# Patient Record
Sex: Female | Born: 1990 | Race: White | Hispanic: No | Marital: Single | Smoking: Former smoker
Health system: Southern US, Community
[De-identification: ages and names within clinical notes are randomized; demographics above are authoritative.]

## PROBLEM LIST (undated history)

## (undated) DIAGNOSIS — J45909 Unspecified asthma, uncomplicated: Secondary | ICD-10-CM

## (undated) DIAGNOSIS — I10 Essential (primary) hypertension: Secondary | ICD-10-CM

## (undated) HISTORY — DX: Unspecified asthma, uncomplicated: J45.909

---
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
## 2010-07-05 ENCOUNTER — Emergency Department: Payer: Self-pay | Admitting: Emergency Medicine

## 2010-11-24 ENCOUNTER — Encounter: Payer: Self-pay | Admitting: Anesthesiology

## 2010-12-03 ENCOUNTER — Encounter: Payer: Self-pay | Admitting: Anesthesiology

## 2011-05-13 ENCOUNTER — Encounter: Payer: Self-pay | Admitting: Anesthesiology

## 2011-06-05 ENCOUNTER — Encounter: Payer: Self-pay | Admitting: Anesthesiology

## 2011-07-13 ENCOUNTER — Emergency Department: Payer: Self-pay | Admitting: Emergency Medicine

## 2012-07-14 ENCOUNTER — Emergency Department: Payer: Self-pay | Admitting: Emergency Medicine

## 2012-07-14 LAB — HCG, QUANTITATIVE, PREGNANCY: Beta Hcg, Quant.: <1 m[IU]/mL — ABNORMAL LOW

## 2013-01-14 ENCOUNTER — Emergency Department: Payer: Self-pay | Admitting: Emergency Medicine

## 2013-01-14 LAB — CBC
HCT: 39.3 % (ref 35.0–47.0)
HGB: 13.3 g/dL (ref 12.0–16.0)
MCH: 30.7 pg (ref 26.0–34.0)
MCHC: 33.9 g/dL (ref 32.0–36.0)
MCV: 91 fL (ref 80–100)
RBC: 4.34 10*6/uL (ref 3.80–5.20)
RDW: 13 % (ref 11.5–14.5)

## 2013-01-14 LAB — COMPREHENSIVE METABOLIC PANEL
Albumin: 3.8 g/dL (ref 3.4–5.0)
Alkaline Phosphatase: 69 U/L (ref 50–136)
BUN: 11 mg/dL (ref 7–18)
Calcium, Total: 9 mg/dL (ref 8.5–10.1)
Co2: 22 mmol/L (ref 21–32)
EGFR (African American): >60
EGFR (Non-African Amer.): >60
Osmolality: 274 (ref 275–301)
SGOT(AST): 19 U/L (ref 15–37)
Sodium: 137 mmol/L (ref 136–145)
Total Protein: 7.2 g/dL (ref 6.4–8.2)

## 2013-01-14 LAB — TROPONIN I: Troponin-I: <0.02 ng/mL

## 2013-06-16 ENCOUNTER — Emergency Department: Payer: Self-pay | Admitting: Emergency Medicine

## 2013-07-23 ENCOUNTER — Emergency Department: Payer: Self-pay | Admitting: Emergency Medicine

## 2013-07-23 LAB — URINALYSIS, COMPLETE
Glucose,UR: NEGATIVE mg/dL (ref 0–75)
Ketone: NEGATIVE
Protein: NEGATIVE
Specific Gravity: 1.011 (ref 1.003–1.030)
Squamous Epithelial: 3
WBC UR: 1 /HPF (ref 0–5)

## 2014-03-08 ENCOUNTER — Emergency Department: Payer: Self-pay | Admitting: Emergency Medicine

## 2014-03-09 LAB — HCG, QUANTITATIVE, PREGNANCY: Beta Hcg, Quant.: 65960 m[IU]/mL — ABNORMAL HIGH

## 2014-03-09 LAB — CBC
HCT: 40.2 % (ref 35.0–47.0)
HGB: 13.5 g/dL (ref 12.0–16.0)
MCH: 30.2 pg (ref 26.0–34.0)
MCHC: 33.6 g/dL (ref 32.0–36.0)
MCV: 90 fL (ref 80–100)
PLATELETS: 269 10*3/uL (ref 150–440)
RBC: 4.47 10*6/uL (ref 3.80–5.20)
RDW: 12.7 % (ref 11.5–14.5)
WBC: 11.5 10*3/uL — ABNORMAL HIGH (ref 3.6–11.0)

## 2014-03-09 LAB — URINALYSIS, COMPLETE
BLOOD: NEGATIVE
Bacteria: NONE SEEN
Bilirubin,UR: NEGATIVE
GLUCOSE, UR: NEGATIVE mg/dL (ref 0–75)
Ketone: NEGATIVE
LEUKOCYTE ESTERASE: NEGATIVE
Nitrite: NEGATIVE
PROTEIN: NEGATIVE
Ph: 5 (ref 4.5–8.0)
RBC,UR: 1 /HPF (ref 0–5)
SPECIFIC GRAVITY: 1.023 (ref 1.003–1.030)
Squamous Epithelial: <1
WBC UR: <1 /HPF (ref 0–5)

## 2014-03-09 LAB — GC/CHLAMYDIA PROBE AMP

## 2014-03-09 LAB — WET PREP, GENITAL

## 2014-09-29 ENCOUNTER — Inpatient Hospital Stay: Payer: Self-pay

## 2014-09-29 LAB — CBC WITH DIFFERENTIAL/PLATELET
Basophil #: 0.1 10*3/uL (ref 0.0–0.1)
Basophil %: 0.7 %
EOS ABS: 0.3 10*3/uL (ref 0.0–0.7)
Eosinophil %: 2.5 %
HCT: 39 % (ref 35.0–47.0)
HGB: 12.9 g/dL (ref 12.0–16.0)
Lymphocyte #: 1.8 10*3/uL (ref 1.0–3.6)
Lymphocyte %: 17.1 %
MCH: 30.4 pg (ref 26.0–34.0)
MCHC: 33.1 g/dL (ref 32.0–36.0)
MCV: 92 fL (ref 80–100)
Monocyte #: 0.9 x10 3/mm (ref 0.2–0.9)
Monocyte %: 8.5 %
NEUTROS ABS: 7.5 10*3/uL — AB (ref 1.4–6.5)
Neutrophil %: 71.2 %
Platelet: 206 10*3/uL (ref 150–440)
RBC: 4.25 10*6/uL (ref 3.80–5.20)
RDW: 14.7 % — ABNORMAL HIGH (ref 11.5–14.5)
WBC: 10.6 10*3/uL (ref 3.6–11.0)

## 2014-09-29 LAB — BASIC METABOLIC PANEL
Anion Gap: 9 (ref 7–16)
BUN: 8 mg/dL (ref 7–18)
CALCIUM: 8.7 mg/dL (ref 8.5–10.1)
CHLORIDE: 109 mmol/L — AB (ref 98–107)
CO2: 20 mmol/L — AB (ref 21–32)
CREATININE: 0.64 mg/dL (ref 0.60–1.30)
EGFR (African American): >60
EGFR (Non-African Amer.): >60
Glucose: 78 mg/dL (ref 65–99)
OSMOLALITY: 273 (ref 275–301)
POTASSIUM: 4.2 mmol/L (ref 3.5–5.1)
Sodium: 138 mmol/L (ref 136–145)

## 2014-09-29 LAB — URIC ACID: Uric Acid: 4.8 mg/dL (ref 2.6–6.0)

## 2014-09-29 LAB — PROTEIN / CREATININE RATIO, URINE
Creatinine, Urine: 47.8 mg/dL (ref 30.0–125.0)
PROTEIN/CREAT. RATIO: 146 mg/g{creat} (ref 0–200)
Protein, Random Urine: 7 mg/dL (ref 0–12)

## 2014-09-29 LAB — SGOT (AST)(ARMC): SGOT(AST): 30 U/L (ref 15–37)

## 2014-09-30 LAB — GC/CHLAMYDIA PROBE AMP

## 2014-10-02 LAB — HEMATOCRIT: HCT: 33.9 % — AB (ref 35.0–47.0)

## 2015-05-02 ENCOUNTER — Encounter: Payer: Self-pay | Admitting: Family Medicine

## 2015-05-02 ENCOUNTER — Ambulatory Visit (INDEPENDENT_AMBULATORY_CARE_PROVIDER_SITE_OTHER): Payer: Medicaid Other | Admitting: Family Medicine

## 2015-05-02 VITALS — BP 118/78 | HR 92 | Temp 98.1°F | Resp 17 | Ht 66.0 in | Wt 218.8 lb

## 2015-05-02 DIAGNOSIS — R519 Headache, unspecified: Secondary | ICD-10-CM | POA: Insufficient documentation

## 2015-05-02 DIAGNOSIS — I10 Essential (primary) hypertension: Secondary | ICD-10-CM | POA: Diagnosis not present

## 2015-05-02 DIAGNOSIS — G43009 Migraine without aura, not intractable, without status migrainosus: Secondary | ICD-10-CM | POA: Diagnosis not present

## 2015-05-02 DIAGNOSIS — R51 Headache: Secondary | ICD-10-CM

## 2015-05-02 MED ORDER — LABETALOL HCL 100 MG PO TABS: 100.0000 mg | ORAL_TABLET | Freq: Two times a day (BID) | ORAL | Status: DC

## 2015-05-02 NOTE — Progress Notes (Signed)
Name: Patricia Duarte   MRN: [id number]    DOB: Aug 20, 1991   Date:05/02/2015       Progress Note  Subjective  Chief Complaint  Chief Complaint  Patient presents with  . Follow-up  . Asthma    Headache  Associated symptoms include nausea, phonophobia and photophobia. Pertinent negatives include no coughing, dizziness, sinus pressure, sore throat or vomiting. She has tried NSAIDs and acetaminophen for the symptoms.   Pt. Is here for headaches, which started after her baby was born in December 2016. She has noticed more frequent headaches recently. She has history of headaches in the past which were thought to be from elevated BP and went away after she was started on BP medications. Recently, these headaches have returned but her BP is controlled this time and she continues to take her medications. Sometimes she experiences nausea, no vomiting, light or sound intensifies the headache. She takes Tylenol or Ibuprofen for these headaches, and it doesn't seem to work.  Past Medical History  Diagnosis Date  . Asthma     Past Surgical History  Procedure Laterality Date  . Cholecystectomy  2008    Family History  Problem Relation Age of Onset  . Hypertension Mother   . Hypertension Brother   . Diabetes Maternal Grandmother   . Hypertension Maternal Grandmother   . Diabetes Maternal Grandfather   . Cancer Maternal Grandfather     bladder  . Hypertension Maternal Grandfather     History   Social History  . Marital Status: Single    Spouse Name: N/A  . Number of Children: N/A  . Years of Education: N/A   Occupational History  . Not on file.   Social History Main Topics  . Smoking status: Never Smoker   . Smokeless tobacco: Never Used  . Alcohol Use: No  . Drug Use: No  . Sexual Activity:    Partners: Male   Other Topics Concern  . Not on file   Social History Narrative  . No narrative on file     Current outpatient prescriptions:  .  albuterol (PROAIR HFA) 108  (90 BASE) MCG/ACT inhaler, Inhale into the lungs., Disp: , Rfl:  .  etonogestrel (NEXPLANON) 68 MG IMPL implant, 1 each by Subdermal route once., Disp: , Rfl:  .  labetalol (NORMODYNE) 100 MG tablet, Take by mouth., Disp: , Rfl:   No Known Allergies   Review of Systems  HENT: Negative for sinus pressure and sore throat.   Eyes: Positive for photophobia.  Respiratory: Negative for cough.   Gastrointestinal: Positive for nausea. Negative for vomiting.  Neurological: Positive for headaches. Negative for dizziness.     Objective  Filed Vitals:   05/02/15 0950  BP: 118/78  Pulse: 92  Temp: 98.1 F (36.7 C)  TempSrc: Oral  Resp: 17  Height: 5\' 6"  (1.676 m)  Weight: 218 lb 12.8 oz (99.247 kg)  SpO2: 97%    Physical Exam  Constitutional: She is oriented to person, place, and time and well-developed, well-nourished, and in no distress.  HENT:  Head: Normocephalic and atraumatic.  Eyes: Pupils are equal, round, and reactive to light.  Cardiovascular: Normal rate and regular rhythm.   Pulmonary/Chest: Effort normal and breath sounds normal.  Abdominal: Soft. Bowel sounds are normal.  Neurological: She is alert and oriented to person, place, and time.  Skin: Skin is warm and dry.  Nursing note and vitals reviewed.     Assessment & Plan  1. Migraine  without aura and without status migrainosus, not intractable Take features are most consistent with migraine. Because of breast-feeding, patient is only taking Tylenol and ibuprofen as needed. She will follow-up with GYN.  2. Benign hypertension Blood pressure is well controlled on present therapy. - labetalol (NORMODYNE) 100 MG tablet; Take 1 tablet (100 mg total) by mouth 2 (two) times daily.  Dispense: 60 tablet; Refill: 2 - CBC w/Diff/Platelet - Comprehensive metabolic panel   Patricia Duarte [name] 05/02/2015 10:06 AM

## 2015-05-03 LAB — COMPREHENSIVE METABOLIC PANEL
A/G RATIO: 1.7 (ref 1.1–2.5)
ALT: 14 IU/L (ref 0–32)
AST: 12 IU/L (ref 0–40)
Albumin: 4.4 g/dL (ref 3.5–5.5)
Alkaline Phosphatase: 95 IU/L (ref 39–117)
BUN/Creatinine Ratio: 14 (ref 8–20)
BUN: 10 mg/dL (ref 6–20)
Bilirubin Total: 0.5 mg/dL (ref 0.0–1.2)
CHLORIDE: 102 mmol/L (ref 97–108)
CO2: 19 mmol/L (ref 18–29)
CREATININE: 0.74 mg/dL (ref 0.57–1.00)
Calcium: 10.1 mg/dL (ref 8.7–10.2)
GFR, EST AFRICAN AMERICAN: 131 mL/min/{1.73_m2} (ref >59)
GFR, EST NON AFRICAN AMERICAN: 114 mL/min/{1.73_m2} (ref >59)
Globulin, Total: 2.6 g/dL (ref 1.5–4.5)
Glucose: 103 mg/dL — ABNORMAL HIGH (ref 65–99)
Potassium: 4.6 mmol/L (ref 3.5–5.2)
Sodium: 140 mmol/L (ref 134–144)
Total Protein: 7 g/dL (ref 6.0–8.5)

## 2015-05-03 LAB — CBC WITH DIFFERENTIAL/PLATELET
BASOS ABS: 0 10*3/uL (ref 0.0–0.2)
Basos: 1 %
EOS (ABSOLUTE): 0.5 10*3/uL — ABNORMAL HIGH (ref 0.0–0.4)
Eos: 7 %
Hematocrit: 41.2 % (ref 34.0–46.6)
Hemoglobin: 14.1 g/dL (ref 11.1–15.9)
IMMATURE GRANULOCYTES: 0 %
Immature Grans (Abs): 0 10*3/uL (ref 0.0–0.1)
LYMPHS ABS: 2.2 10*3/uL (ref 0.7–3.1)
LYMPHS: 29 %
MCH: 30 pg (ref 26.6–33.0)
MCHC: 34.2 g/dL (ref 31.5–35.7)
MCV: 88 fL (ref 79–97)
MONOCYTES: 8 %
MONOS ABS: 0.6 10*3/uL (ref 0.1–0.9)
Neutrophils Absolute: 4.1 10*3/uL (ref 1.4–7.0)
Neutrophils: 55 %
PLATELETS: 308 10*3/uL (ref 150–379)
RBC: 4.7 x10E6/uL (ref 3.77–5.28)
RDW: 13.6 % (ref 12.3–15.4)
WBC: 7.5 10*3/uL (ref 3.4–10.8)

## 2015-05-22 ENCOUNTER — Telehealth: Payer: Self-pay | Admitting: Family Medicine

## 2015-05-22 DIAGNOSIS — Z01419 Encounter for gynecological examination (general) (routine) without abnormal findings: Secondary | ICD-10-CM

## 2015-05-22 NOTE — Telephone Encounter (Signed)
Received letter in the mail stating that it is time for her pap. Requesting a referral to be sent to [facility]. Last seen here 04-2015

## 2015-05-22 NOTE — Telephone Encounter (Signed)
Routed to Dr. Sherryll Burger patient is requesting referral

## 2015-05-23 NOTE — Telephone Encounter (Signed)
Referral has been ordered, routed to [facility] for appointment scheduling

## 2015-08-04 ENCOUNTER — Encounter: Payer: Self-pay | Admitting: Family Medicine

## 2015-08-04 ENCOUNTER — Ambulatory Visit (INDEPENDENT_AMBULATORY_CARE_PROVIDER_SITE_OTHER): Payer: Medicaid Other | Admitting: Family Medicine

## 2015-08-04 VITALS — BP 118/82 | HR 88 | Temp 98.8°F | Resp 16 | Ht 66.0 in | Wt 221.6 lb

## 2015-08-04 DIAGNOSIS — I10 Essential (primary) hypertension: Secondary | ICD-10-CM

## 2015-08-04 DIAGNOSIS — Z23 Encounter for immunization: Secondary | ICD-10-CM

## 2015-08-04 DIAGNOSIS — Z309 Encounter for contraceptive management, unspecified: Secondary | ICD-10-CM | POA: Insufficient documentation

## 2015-08-04 DIAGNOSIS — Z975 Presence of (intrauterine) contraceptive device: Secondary | ICD-10-CM

## 2015-08-04 DIAGNOSIS — L309 Dermatitis, unspecified: Secondary | ICD-10-CM

## 2015-08-04 DIAGNOSIS — J309 Allergic rhinitis, unspecified: Secondary | ICD-10-CM | POA: Insufficient documentation

## 2015-08-04 MED ORDER — LABETALOL HCL 100 MG PO TABS: 100.0000 mg | ORAL_TABLET | Freq: Two times a day (BID) | ORAL | Status: DC

## 2015-08-04 NOTE — Progress Notes (Signed)
Name: Patricia Duarte   MRN: [id number]    DOB: 11-25-1990   Date:08/04/2015       Progress Note  Subjective  Chief Complaint  Chief Complaint  Patient presents with  . Rash    legs and arms  . Contraception    discuss Nexplanon   Rash This is a new problem. Episode onset: 1 month ago. The problem is unchanged. The affected locations include the left lower leg and right lowerleg (started behind the knees). The rash is characterized by blistering, itchiness, peeling and dryness. Pertinent negatives include no fever, joint pain, shortness of breath or sore throat. Past treatments include topical steroids. The treatment provided mild relief.  Hypertension This is a chronic problem. The problem is unchanged. The problem is controlled. Pertinent negatives include no blurred vision, chest pain, headaches, palpitations or shortness of breath. Past treatments include beta blockers.    Pt. Wants to have a referral to [facility] to have her Nexplanon removed. She had it inserted in February 2016 . She reports having pain at the site of insertion, sometimes shooting down through her arm. Would like to have it removed and restart on pills for birth control.  Past Medical History  Diagnosis Date  . Asthma     Past Surgical History  Procedure Laterality Date  . Cholecystectomy  2008    Family History  Problem Relation Age of Onset  . Hypertension Mother   . Hypertension Brother   . Diabetes Maternal Grandmother   . Hypertension Maternal Grandmother   . Diabetes Maternal Grandfather   . Cancer Maternal Grandfather     bladder  . Hypertension Maternal Grandfather     Social History   Social History  . Marital Status: Single    Spouse Name: N/A  . Number of Children: N/A  . Years of Education: N/A   Occupational History  . Not on file.   Social History Main Topics  . Smoking status: Never Smoker   . Smokeless tobacco: Never Used  . Alcohol Use: No  . Drug Use: No  .  Sexual Activity:    Partners: Male   Other Topics Concern  . Not on file   Social History Narrative    Current outpatient prescriptions:  .  albuterol (PROAIR HFA) 108 (90 BASE) MCG/ACT inhaler, Inhale into the lungs., Disp: , Rfl:  .  etonogestrel (NEXPLANON) 68 MG IMPL implant, 1 each by Subdermal route once., Disp: , Rfl:  .  labetalol (NORMODYNE) 100 MG tablet, Take 1 tablet (100 mg total) by mouth 2 (two) times daily., Disp: 60 tablet, Rfl: 2  No Known Allergies   Review of Systems  Constitutional: Negative for fever, chills and weight loss.  HENT: Negative for sore throat.   Eyes: Negative for blurred vision.  Respiratory: Negative for shortness of breath.   Cardiovascular: Negative for chest pain and palpitations.  Musculoskeletal: Negative for joint pain.  Skin: Positive for itching and rash.  Neurological: Negative for headaches.     Objective  Filed Vitals:   08/04/15 0917  BP: 118/82  Pulse: 88  Temp: 98.8 F (37.1 C)  TempSrc: Oral  Resp: 16  Height: 5\' 6"  (1.676 m)  Weight: 221 lb 9.6 oz (100.517 kg)  SpO2: 97%    Physical Exam  Constitutional: She is oriented to person, place, and time and well-developed, well-nourished, and in no distress.  Cardiovascular: Normal rate, regular rhythm and normal heart sounds.   No murmur heard. Pulmonary/Chest: Effort  normal and breath sounds normal. No respiratory distress.  Neurological: She is alert and oriented to person, place, and time.  Skin: Rash noted. Rash is maculopapular.  maculo-papular, erythematous, dry-appearing rash over the lateral right and left knees, and on the right foot.  Nursing note and vitals reviewed.   Assessment & Plan  1. 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 Referral to dermatology to suggest optimal treatment for dermatitis. - Ambulatory referral to Dermatology  2. Benign hypertension Blood pressure controlled on  present therapy. - labetalol (NORMODYNE) 100 MG tablet; Take 1 tablet (100 mg total) by mouth 2 (two) times daily.  Dispense: 60 tablet; Refill: 2  3. Nexplanon in place Referral to [facility]. - Ambulatory referral to Obstetrics / Gynecology   Hazel Sams [facility] 08/04/2015 9:24 AM

## 2015-11-04 ENCOUNTER — Encounter: Payer: Self-pay | Admitting: Family Medicine

## 2015-11-04 ENCOUNTER — Ambulatory Visit
Admission: RE | Admit: 2015-11-04 | Discharge: 2015-11-04 | Disposition: A | Discharge status: Home / Self Care | Payer: Medicaid Other | Source: Ambulatory Visit | Attending: Family Medicine | Admitting: Family Medicine

## 2015-11-04 ENCOUNTER — Ambulatory Visit (INDEPENDENT_AMBULATORY_CARE_PROVIDER_SITE_OTHER): Payer: Medicaid Other | Admitting: Family Medicine

## 2015-11-04 VITALS — BP 132/84 | HR 90 | Temp 97.7°F | Resp 14 | Ht 66.0 in | Wt 223.6 lb

## 2015-11-04 DIAGNOSIS — M25532 Pain in left wrist: Secondary | ICD-10-CM

## 2015-11-04 DIAGNOSIS — E669 Obesity, unspecified: Secondary | ICD-10-CM | POA: Insufficient documentation

## 2015-11-04 DIAGNOSIS — E66812 Obesity, class 2: Secondary | ICD-10-CM

## 2015-11-04 DIAGNOSIS — I10 Essential (primary) hypertension: Secondary | ICD-10-CM

## 2015-11-04 DIAGNOSIS — R938 Abnormal findings on diagnostic imaging of other specified body structures: Secondary | ICD-10-CM | POA: Diagnosis not present

## 2015-11-04 LAB — POCT GLYCOSYLATED HEMOGLOBIN (HGB A1C): HEMOGLOBIN A1C: 5.5

## 2015-11-04 MED ORDER — LABETALOL HCL 100 MG PO TABS: 100.0000 mg | ORAL_TABLET | Freq: Two times a day (BID) | ORAL | Status: DC

## 2015-11-04 NOTE — Progress Notes (Signed)
Name: Patricia Duarte   MRN: [id number]    DOB: Aug 23, 1991   Date:11/04/2015       Progress Note  Subjective  Chief Complaint  Chief Complaint  Patient presents with  . Medication Refill    3 month follow-up  . Hypertension  . Wrist Pain    left onset 2 weeks.  Unknown trauma    Hypertension This is a chronic problem. The problem is controlled. Pertinent negatives include no blurred vision, chest pain, headaches, malaise/fatigue, neck pain, palpitations or shortness of breath. Past treatments include beta blockers. There are no compliance problems.  There is no history of kidney disease or CAD/MI.  Wrist Pain  The pain is present in the left wrist. This is a new problem. Episode onset: 2 weeks ago. There has been no history of extremity trauma. The problem has been unchanged. The quality of the pain is described as sharp (sometimes, it radiated up towards her elbow). The pain is at a severity of 5/10. Pertinent negatives include no fever, inability to bear weight, joint swelling, numbness, stiffness or tingling. She has tried rest and acetaminophen (has been using wrist support at work) for the symptoms. The treatment provided mild relief. Family history does not include gout. There is no history of osteoarthritis or rheumatoid arthritis.  Obesity Pt. Is here to discuss strategies for weight loss. Her weight is 223 lbs today (BMI 36.09). She reports a balanced diet (avoids sugary foods, eats salads at work, avoids fried foods). She is not physically active except for her work as a Conservation officer, nature at [employer].   Past Medical History  Diagnosis Date  . Asthma     Past Surgical History  Procedure Laterality Date  . Cholecystectomy  2008    Family History  Problem Relation Age of Onset  . Hypertension Mother   . Hypertension Brother   . Diabetes Maternal Grandmother   . Hypertension Maternal Grandmother   . Diabetes Maternal Grandfather   . Cancer Maternal Grandfather     bladder  .  Hypertension Maternal Grandfather     Social History   Social History  . Marital Status: Single    Spouse Name: N/A  . Number of Children: N/A  . Years of Education: N/A   Occupational History  . Not on file.   Social History Main Topics  . Smoking status: Never Smoker   . Smokeless tobacco: Never Used  . Alcohol Use: No  . Drug Use: No  . Sexual Activity:    Partners: Male   Other Topics Concern  . Not on file   Social History Narrative     Current outpatient prescriptions:  .  albuterol (PROAIR HFA) 108 (90 BASE) MCG/ACT inhaler, Inhale into the lungs., Disp: , Rfl:  .  labetalol (NORMODYNE) 100 MG tablet, Take 1 tablet (100 mg total) by mouth 2 (two) times daily., Disp: 60 tablet, Rfl: 2  No Known Allergies   Review of Systems  Constitutional: Negative for fever, chills, weight loss and malaise/fatigue.  Eyes: Negative for blurred vision.  Respiratory: Negative for cough and shortness of breath.   Cardiovascular: Negative for chest pain and palpitations.  Gastrointestinal: Negative for abdominal pain.  Musculoskeletal: Positive for joint pain. Negative for back pain, stiffness and neck pain.  Neurological: Negative for tingling, numbness and headaches.    Objective  Filed Vitals:   11/04/15 0900  BP: 132/84  Pulse: 90  Temp: 97.7 F (36.5 C)  TempSrc: Oral  Resp: 14  Height:  (1.676 m)  Weight: 223 lb 9.6 oz (101.424 kg)  SpO2: 93%    Physical Exam  Constitutional: She is oriented to person, place, and time and well-developed, well-nourished, and in no distress.  HENT:  Head: Normocephalic and atraumatic.  Eyes: Pupils are equal, round, and reactive to light.  Cardiovascular: Normal rate, regular rhythm and normal heart sounds.   No murmur heard. Pulmonary/Chest: Effort normal and breath sounds normal. She has no wheezes.  Abdominal: Soft. Bowel sounds are normal.  Musculoskeletal: She exhibits no edema.  Neurological: She is alert and  oriented to person, place, and time.  Psychiatric: Mood, memory, affect and judgment normal.  Nursing note and vitals reviewed.    Assessment & Plan  1. Benign hypertension Stable on present therapy. - labetalol (NORMODYNE) 100 MG tablet; Take 1 tablet (100 mg total) by mouth 2 (two) times daily.  Dispense: 60 tablet; Refill: 2  2. Obesity, Class II, BMI 35-39.9 Encouraged to start gradual exercise program. Refer to [facility]. - Lipid Profile - TSH - Comprehensive Metabolic Panel (CMET) - POCT HgB A1C - Amb ref to Medical Nutrition Therapy-MNT  3. Acute pain of left wrist Unclear etiology. We will obtain x-ray to rule out fracture. - DG Wrist Complete Left; Future   Davina Howlett [name] 11/04/2015 9:43 AM

## 2015-11-05 LAB — COMPREHENSIVE METABOLIC PANEL
ALT: 17 IU/L (ref 0–32)
AST: 12 IU/L (ref 0–40)
Albumin/Globulin Ratio: 1.6 (ref 1.1–2.5)
Albumin: 4.4 g/dL (ref 3.5–5.5)
Alkaline Phosphatase: 82 IU/L (ref 39–117)
BUN/Creatinine Ratio: 13 (ref 8–20)
BUN: 9 mg/dL (ref 6–20)
Bilirubin Total: 0.6 mg/dL (ref 0.0–1.2)
CALCIUM: 9.8 mg/dL (ref 8.7–10.2)
CO2: 23 mmol/L (ref 18–29)
CREATININE: 0.68 mg/dL (ref 0.57–1.00)
Chloride: 103 mmol/L (ref 96–106)
GFR calc Af Amer: 141 mL/min/{1.73_m2} (ref >59)
GFR calc non Af Amer: 122 mL/min/{1.73_m2} (ref >59)
GLOBULIN, TOTAL: 2.8 g/dL (ref 1.5–4.5)
GLUCOSE: 101 mg/dL — AB (ref 65–99)
Potassium: 5.3 mmol/L — ABNORMAL HIGH (ref 3.5–5.2)
Sodium: 141 mmol/L (ref 134–144)
Total Protein: 7.2 g/dL (ref 6.0–8.5)

## 2015-11-05 LAB — LIPID PANEL
CHOL/HDL RATIO: 5.6 ratio — AB (ref 0.0–4.4)
Cholesterol, Total: 169 mg/dL (ref 100–199)
HDL: 30 mg/dL — ABNORMAL LOW (ref >39)
LDL Calculated: 101 mg/dL — ABNORMAL HIGH (ref 0–99)
TRIGLYCERIDES: 191 mg/dL — AB (ref 0–149)
VLDL Cholesterol Cal: 38 mg/dL (ref 5–40)

## 2015-11-05 LAB — TSH: TSH: 1.18 u[IU]/mL (ref 0.450–4.500)

## 2015-11-06 ENCOUNTER — Other Ambulatory Visit: Payer: Self-pay | Admitting: Family Medicine

## 2015-11-06 DIAGNOSIS — M25532 Pain in left wrist: Secondary | ICD-10-CM

## 2015-12-25 ENCOUNTER — Encounter: Payer: Medicaid Other | Attending: Family Medicine | Admitting: Dietician

## 2015-12-25 ENCOUNTER — Encounter: Payer: Self-pay | Admitting: Dietician

## 2015-12-25 VITALS — Ht 67.0 in | Wt 227.2 lb

## 2015-12-25 DIAGNOSIS — E669 Obesity, unspecified: Secondary | ICD-10-CM | POA: Diagnosis not present

## 2015-12-25 NOTE — Patient Instructions (Addendum)
Omit fries when picking up chicken biscuit at breakfast. Ask for half tea and half sweet tea/unsweetened at breakfast or water. Continue with taking your lunch with the current balance of starch, protein, fruit and portioned chips.  Take raw vegetables with lunch. Portion afternoon snack such as mini bag of popcorn. Can add a fruit at any time. Measure out a few of the starchy foods.  Balance meals with protein, 2-3 carbohydrate foods and non-starchy vegetables. Walk for exercise: 2-3 x/week

## 2015-12-25 NOTE — Progress Notes (Signed)
Medical Nutrition Therapy: Visit start time: 1315  end time: 1415 Assessment:  Diagnosis: obesity Past medical history: hypertension Psychosocial issues/ stress concerns: Patient rates her stress as "moderate" and indicates "not so well" as to how well she is dealing with her stress; often copes by eating.  Her PHQ-2 score was 0. Preferred learning method:  . Visual  Current weight: 227.2 lbs  Height: 67 in Medications, supplements: see list Progress and evaluation:  Patient in for initial medical nutrition therapy visit. She reports she has been overweight since childhood and remembers that she weighed 160 lbs in the 7th grade. She does not have a history of dieting and reports that she felt motivated to make diet changes after her doctor stated, "We have to do something about this weight". She has decreased sodas from 6-8 (12oz) cans to 1 can at lunch. She continues to choose sweetened tea when eating "out' but reports she is sometimes mixing unsweetened with sweet tea.She is preparing and taking her lunch to work rather than eating at her work place [employer] where she was making high fat choices (chicken wings, macaroni/cheese, 4 rolls, jalapeno poppers, etc). She is also eating a small snack in the afternoon to help decrease her hunger at her evening meal but reports her portions in the evening are still too large. She has a 5215 month old and occasionally breastfeeds.  She reports that she lost weight during the pregnancy due to nausea/vomiting and her present weight is the same as her pregravid weight and reports it is her highest weight.  She also reports that she was told that her triglycerides are elevated.  Her present diet is low in fruits, vegetables, fiber and calcium sources.   Physical activity: none. Just yesterday she went walking with a co-worker who lives on her street and she states they plan to do as many days as they can.  Dietary Intake:  Usual eating pattern includes 3 meals and  1-2 snacks per day. Dining out frequency: 8-11 meals per week.  Breakfast: 6:00am- Cajun chicken biscuit, fries, sweet tea 4-5 days/week Lunch: 11:00am- She prepares and take lunch: Malaw[place]urkey or chicken sandwich on ww sandwich rounds, snack size chips, fruit cup; sometimes a sweet/salty nut bar, 12 oz soda Snack: mini bag popcorn or chips, water; sometimes a mini pizza Supper: 7:00-8:00pm- often eats spaghetti because she says this is the main meal her 4815 month old will eat. Take out might be 4-5 tacos, or a grilled chicken salad with ranch dressing or a chicken sandwich/fries, tea or water Snack: Doesn't usually snack after dinner. Beverages: water, 12 Coke, large sweet tea for breakfast and at dinner several nights per week although has been mixing sweet with unsweetened at some of the dinner meals.  Nutrition Care Education: Weight control: Commended patient on positive diet changes she is already making. Discussed continuing to progress with healthier choices verses restrictive dieting. Use food guide plate and food models to discuss balance of carbohydrate, protein, small amounts of fat and non-starchy vegetables. Based recommended servings on 1600-1700 calories but discouraged total focus on calories. Worked with patient to identify further steps she could take to improve eating habits and promote weight loss over time. Showed her website (also a phone app) Connect[employer]st.secalorieking.com. Looked up her typical breakfast meal showing her that she consumed 90% of recommended fat grams and about 60% of recommended calories for the day in that meal. Stressed importance of exercise with weight loss efforts as well as lowering blood pressure  and triglycerides. Also, discussed how decreasing fat and sugar in the diet will help to lower triglycerides.  Nutritional Diagnosis:  Struthers-3.3 Overweight/obesity As related to frequency of dining out often making high fat choices, large portions for evening meal and lack of  exercise..  As evidenced by weight, diet and exercise history.  Intervention:  Omit fries when picking up chicken biscuit at breakfast. Ask for half tea and half sweet tea/unsweetened at breakfast or water. Continue with taking your lunch with the current balance of starch, protein, fruit and portioned chips.  Take raw vegetables with lunch. Portion afternoon snack such as mini bag of popcorn. Can add a fruit at any time. Measure out a few of the starchy foods.  Balance meals with protein, 2-3 carbohydrate foods and non-starchy vegetables. Walk for exercise: 2-3 x/week   Education Materials given:  . Food lists/ Planning A Balanced Meal . Sample meal pattern/ menus . Goals/ instructions Learner/ who was taught:  . Patient  Level of understanding: . Partial understanding; needs review/ practice Learning barriers: . None Willingness to learn/ readiness for change: . Eager, change in progress Monitoring and Evaluation:  Dietary intake, exercise,  and body weight      follow up: 01/20/16 at 10:30am

## 2016-01-20 ENCOUNTER — Ambulatory Visit: Payer: Medicaid Other | Admitting: Dietician

## 2016-01-30 ENCOUNTER — Ambulatory Visit: Payer: Medicaid Other | Admitting: Family Medicine

## 2016-02-02 ENCOUNTER — Ambulatory Visit: Payer: Medicaid Other | Admitting: Family Medicine

## 2016-02-20 ENCOUNTER — Encounter: Payer: Self-pay | Admitting: Dietician

## 2016-04-20 ENCOUNTER — Encounter: Payer: Self-pay | Admitting: Family Medicine

## 2016-04-20 ENCOUNTER — Ambulatory Visit (INDEPENDENT_AMBULATORY_CARE_PROVIDER_SITE_OTHER): Payer: Medicaid Other | Admitting: Family Medicine

## 2016-04-20 VITALS — BP 128/70 | HR 83 | Temp 98.4°F | Resp 16 | Ht 67.0 in | Wt 228.0 lb

## 2016-04-20 DIAGNOSIS — I10 Essential (primary) hypertension: Secondary | ICD-10-CM

## 2016-04-20 DIAGNOSIS — E782 Mixed hyperlipidemia: Secondary | ICD-10-CM | POA: Diagnosis not present

## 2016-04-20 DIAGNOSIS — E781 Pure hyperglyceridemia: Secondary | ICD-10-CM | POA: Insufficient documentation

## 2016-04-20 MED ORDER — LABETALOL HCL 100 MG PO TABS: 100.0000 mg | ORAL_TABLET | Freq: Two times a day (BID) | ORAL | Status: DC

## 2016-04-20 NOTE — Progress Notes (Signed)
Name: Patricia MillardRegan C May [name]   MRN: [id number]    DOB: 12/08/1990   Date:04/20/2016       Progress Note  Subjective  Chief Complaint  Chief Complaint  Patient presents with  . Hypertension    follow up BP check, medication refill    Hypertension This is a chronic problem. The problem is controlled. Pertinent negatives include no blurred vision, chest pain, headaches or palpitations. Past treatments include beta blockers. There is no history of kidney disease, CAD/MI or CVA.  Hyperlipidemia This is a chronic problem. The problem is uncontrolled. Recent lipid tests were reviewed and are high (Elevated Triglycerides and LDL, below normal HDL.). Exacerbating diseases include obesity. Pertinent negatives include no chest pain. She is currently on no antihyperlipidemic treatment.    Past Medical History  Diagnosis Date  . Asthma     Past Surgical History  Procedure Laterality Date  . Cholecystectomy  2008    Family History  Problem Relation Age of Onset  . Hypertension Mother   . Hypertension Brother   . Diabetes Maternal Grandmother   . Hypertension Maternal Grandmother   . Diabetes Maternal Grandfather   . Cancer Maternal Grandfather     bladder  . Hypertension Maternal Grandfather     Social History   Social History  . Marital Status: Single    Spouse Name: N/A  . Number of Children: N/A  . Years of Education: N/A   Occupational History  . Not on file.   Social History Main Topics  . Smoking status: Former Games developermoker  . Smokeless tobacco: Never Used  . Alcohol Use: No  . Drug Use: No  . Sexual Activity:    Partners: Male   Other Topics Concern  . Not on file   Social History Narrative     Current outpatient prescriptions:  .  albuterol (PROAIR HFA) 108 (90 BASE) MCG/ACT inhaler, Inhale into the lungs., Disp: , Rfl:  .  labetalol (NORMODYNE) 100 MG tablet, Take 1 tablet (100 mg total) by mouth 2 (two) times daily., Disp: 60 tablet, Rfl: 2 .  NORETHINDRONE PO,  Take by mouth., Disp: , Rfl:   No Known Allergies   Review of Systems  Eyes: Negative for blurred vision.  Cardiovascular: Negative for chest pain and palpitations.  Neurological: Negative for headaches.    Objective  Filed Vitals:   04/20/16 1424  BP: 128/70  Pulse: 83  Temp: 98.4 F (36.9 C)  TempSrc: Oral  Resp: 16  Height: 5\' 7"  (1.702 m)  Weight: 228 lb (103.42 kg)  SpO2: 99%    Physical Exam  Constitutional: She is oriented to person, place, and time and well-developed, well-nourished, and in no distress.  HENT:  Head: Normocephalic and atraumatic.  Cardiovascular: Normal rate, regular rhythm and normal heart sounds.   No murmur heard. Pulmonary/Chest: Effort normal and breath sounds normal. She has no wheezes.  Abdominal: Soft. Bowel sounds are normal.  Neurological: She is alert and oriented to person, place, and time.  Psychiatric: Mood, memory, affect and judgment normal.  Nursing note and vitals reviewed.      Assessment & Plan  1. Benign hypertension BP stable and controlled on present antihypertensive therapy. - labetalol (NORMODYNE) 100 MG tablet; Take 1 tablet (100 mg total) by mouth 2 (two) times daily.  Dispense: 60 tablet; Refill: 5  2. Hypercholesterolemia with hypertriglyceridemia  Consider starting on triglyceride lowering therapy if over 200 - Lipid Profile - COMPLETE METABOLIC PANEL WITH GFR   Kathi LudwigSyed  [name] 04/20/2016 2:32 PM

## 2016-04-21 LAB — COMPLETE METABOLIC PANEL WITH GFR
ALBUMIN: 4.4 g/dL (ref 3.6–5.1)
ALK PHOS: 54 U/L (ref 33–115)
ALT: 25 U/L (ref 6–29)
AST: 20 U/L (ref 10–30)
BILIRUBIN TOTAL: 0.9 mg/dL (ref 0.2–1.2)
BUN: 10 mg/dL (ref 7–25)
CALCIUM: 9.2 mg/dL (ref 8.6–10.2)
CO2: 22 mmol/L (ref 20–31)
CREATININE: 0.69 mg/dL (ref 0.50–1.10)
Chloride: 106 mmol/L (ref 98–110)
GLUCOSE: 90 mg/dL (ref 65–99)
POTASSIUM: 4.4 mmol/L (ref 3.5–5.3)
SODIUM: 138 mmol/L (ref 135–146)
TOTAL PROTEIN: 7.1 g/dL (ref 6.1–8.1)

## 2016-04-21 LAB — LIPID PANEL
CHOL/HDL RATIO: 6 ratio — AB (ref <=5.0)
Cholesterol: 161 mg/dL (ref 125–200)
HDL: 27 mg/dL — AB (ref >=46)
LDL Cholesterol: 87 mg/dL (ref <130)
TRIGLYCERIDES: 233 mg/dL — AB (ref <150)
VLDL: 47 mg/dL — ABNORMAL HIGH (ref <30)

## 2016-05-10 ENCOUNTER — Telehealth: Payer: Self-pay | Admitting: Emergency Medicine

## 2016-05-10 NOTE — Telephone Encounter (Addendum)
Called patient again and left additional message for her to return call.  Patient returned called on 05/11/2016 and was notified of labs

## 2016-06-02 ENCOUNTER — Other Ambulatory Visit: Payer: Self-pay | Admitting: Family Medicine

## 2016-06-02 DIAGNOSIS — I10 Essential (primary) hypertension: Secondary | ICD-10-CM

## 2016-06-09 ENCOUNTER — Other Ambulatory Visit: Payer: Self-pay | Admitting: Family Medicine

## 2016-06-09 DIAGNOSIS — I10 Essential (primary) hypertension: Secondary | ICD-10-CM

## 2016-07-16 ENCOUNTER — Emergency Department
Admission: EM | Admit: 2016-07-16 | Discharge: 2016-07-16 | Disposition: A | Discharge status: Home / Self Care | Payer: Medicaid Other | Attending: Student | Admitting: Student

## 2016-07-16 ENCOUNTER — Encounter: Payer: Self-pay | Admitting: Emergency Medicine

## 2016-07-16 DIAGNOSIS — J45909 Unspecified asthma, uncomplicated: Secondary | ICD-10-CM | POA: Insufficient documentation

## 2016-07-16 DIAGNOSIS — Y999 Unspecified external cause status: Secondary | ICD-10-CM | POA: Insufficient documentation

## 2016-07-16 DIAGNOSIS — Y9389 Activity, other specified: Secondary | ICD-10-CM | POA: Insufficient documentation

## 2016-07-16 DIAGNOSIS — T192XXA Foreign body in vulva and vagina, initial encounter: Secondary | ICD-10-CM

## 2016-07-16 DIAGNOSIS — Z87891 Personal history of nicotine dependence: Secondary | ICD-10-CM | POA: Insufficient documentation

## 2016-07-16 DIAGNOSIS — X58XXXA Exposure to other specified factors, initial encounter: Secondary | ICD-10-CM | POA: Insufficient documentation

## 2016-07-16 DIAGNOSIS — Z79899 Other long term (current) drug therapy: Secondary | ICD-10-CM | POA: Insufficient documentation

## 2016-07-16 DIAGNOSIS — Y929 Unspecified place or not applicable: Secondary | ICD-10-CM | POA: Insufficient documentation

## 2016-07-16 NOTE — ED Triage Notes (Signed)
Pt states she thinks condom is in vagina but cannot locate it.

## 2016-07-16 NOTE — ED Provider Notes (Signed)
[facility] Emergency Department Provider Note   ____________________________________________   First MD Initiated Contact with Patient 07/16/16 0215     (approximate)  I have reviewed the triage vital signs and the nursing notes.   HISTORY  Chief Complaint Foreign Body in Vagina    HPI Patricia Duarte is a 25 y.o. female with HTN and hyperlipidemia who presents for evaluation of a retained condom in her vagina, this occurred suddenly just prior to arrival while she was having intercourse, severe, no modifying factors. He denies any other complaints. No abdominal pain, no abnormal vaginal bleeding or vaginal discharge. No chest pain or difficulty breathing. No recent illness. She reports that she and her partner attempted to retrieve the condom however they were unable to locate it.   Past Medical History:  Diagnosis Date  . Asthma     Patient Active Problem List   Diagnosis Date Noted  . Hypercholesterolemia with hypertriglyceridemia 04/20/2016  . Obesity, Class II, BMI 35-39.9 11/04/2015  . Acute pain of left wrist 11/04/2015  . Dermatitis 08/04/2015  . Contraception management 08/04/2015  . Nexplanon in place 08/04/2015  . Allergic rhinitis 08/04/2015  . Migraine headache without aura 05/02/2015  . Nonintractable episodic headache 05/02/2015  . Benign hypertension 05/02/2015    Past Surgical History:  Procedure Laterality Date  . CHOLECYSTECTOMY  2008    Prior to Admission medications   Medication Sig Start Date End Date Taking? Authorizing Provider  albuterol (PROAIR HFA) 108 (90 BASE) MCG/ACT inhaler Inhale into the lungs.    Historical Provider, MD  labetalol (NORMODYNE) 100 MG tablet TAKE ONE TABLET BY MOUTH TWICE DAILY 06/09/16   Ellyn HackSyed Asad A Shah, MD  NORETHINDRONE PO Take by mouth.    Historical Provider, MD    Allergies Review of patient's allergies indicates no known allergies.  Family History  Problem Relation Age of Onset   . Hypertension Mother   . Hypertension Brother   . Diabetes Maternal Grandmother   . Hypertension Maternal Grandmother   . Diabetes Maternal Grandfather   . Cancer Maternal Grandfather     bladder  . Hypertension Maternal Grandfather     Social History Social History  Substance Use Topics  . Smoking status: Former Games developermoker  . Smokeless tobacco: Never Used  . Alcohol use No    Review of Systems Constitutional: No fever/chills Eyes: No visual changes. ENT: No sore throat. Cardiovascular: Denies chest pain. Respiratory: Denies shortness of breath. Gastrointestinal: No abdominal pain.  No nausea, no vomiting.  No diarrhea.  No constipation. Genitourinary: Negative for dysuria. Musculoskeletal: Negative for back pain. Skin: Negative for rash. Neurological: Negative for headaches, focal weakness or numbness.  10-point ROS otherwise negative.  ____________________________________________   PHYSICAL EXAM:  Vitals:   07/16/16 0152 07/16/16 0153 07/16/16 0223  BP:  (!) 140/94   Pulse: (!) 103  85  Resp: 18    Temp: 98.3 F (36.8 C)    TempSrc: Oral    SpO2: 98%  99%  Weight: 223 lb (101.2 kg)    Height: 5\' 7"  (1.702 m)      VITAL SIGNS: ED Triage Vitals  Enc Vitals Group     BP 07/16/16 0153 (!) 140/94     Pulse Rate 07/16/16 0152 (!) 103     Resp 07/16/16 0152 18     Temp 07/16/16 0152 98.3 F (36.8 C)     Temp Source 07/16/16 0152 Oral     SpO2 07/16/16 0152 98 %  Weight 07/16/16 0152 223 lb (101.2 kg)     Height 07/16/16 0152 5\' 7"  (1.702 m)     Head Circumference --      Peak Flow --      Pain Score --      Pain Loc --      Pain Edu? --      Excl. in GC? --     Constitutional: Alert and oriented. Well appearing and in no acute distress. Eyes: Conjunctivae are normal. PERRL. EOMI. Head: Atraumatic. Nose: No congestion/rhinnorhea. Mouth/Throat: Mucous membranes are moist.  Oropharynx non-erythematous. Neck: No stridor. Supple without  meningismus. Cardiovascular: Normal rate, regular rhythm. Grossly normal heart sounds.  Good peripheral circulation. Respiratory: Normal respiratory effort.  No retractions. Lungs CTAB. Gastrointestinal: Soft and nontender. No distention. No CVA tenderness. Genitourinary: Intact condom in the vaginal vault removed with ring forceps, os closed no abnormal discharge, no bleeding. Musculoskeletal: No lower extremity tenderness nor edema.  No joint effusions. Neurologic:  Normal speech and language. No gross focal neurologic deficits are appreciated. No gait instability. Skin:  Skin is warm, dry and intact. No rash noted. Psychiatric: Mood and affect are normal. Speech and behavior are normal.  ____________________________________________   LABS (all labs ordered are listed, but only abnormal results are displayed)  Labs Reviewed - No data to display ____________________________________________  EKG  none ____________________________________________  RADIOLOGY  none ____________________________________________   PROCEDURES  Procedure(s) performed:  Vaginal foreign body removal With the patient in lithotomy position, speculum was inserted. The condom immediately became visible in the posterior aspect of the vaginal vault and was removed easily with ring forceps. Condom was intact. Patient tolerated the procedure well. .  Procedures  Critical Care performed: No  ____________________________________________   INITIAL IMPRESSION / ASSESSMENT AND PLAN / ED COURSE  Pertinent labs & imaging results that were available during my care of the patient were reviewed by me and considered in my medical decision making (see chart for details).  Patricia Duarte Patricia Duarte is a 25 y.o. female with HTN and hyperlipidemia who presents for evaluation of a retained condom in her vagina which was easily removed on arrival with ring forceps. She appears well. We discussed return precautions and need for  close PCP follow-up as needed. She is comfortable with the discharge plan. DC home.  Clinical Course     ____________________________________________   FINAL CLINICAL IMPRESSION(S) / ED DIAGNOSES  Final diagnoses:  Foreign body in vagina, initial encounter      NEW MEDICATIONS STARTED DURING THIS VISIT:  New Prescriptions   No medications on file     Note:  This document was prepared using Dragon voice recognition software and Duarte include unintentional dictation errors.    Gayla Doss, MD 07/16/16 [phone]

## 2016-07-16 NOTE — ED Notes (Signed)
Pt states she as having sex tonight and condom came off inside vagina, denies any pain.

## 2016-10-21 ENCOUNTER — Ambulatory Visit: Payer: Medicaid Other | Admitting: Family Medicine

## 2016-12-15 ENCOUNTER — Encounter: Payer: Self-pay | Admitting: Family Medicine

## 2016-12-15 ENCOUNTER — Ambulatory Visit (INDEPENDENT_AMBULATORY_CARE_PROVIDER_SITE_OTHER): Payer: Medicaid Other | Admitting: Family Medicine

## 2016-12-15 VITALS — BP 130/96 | HR 88 | Temp 98.1°F | Resp 16 | Ht 67.0 in | Wt 207.9 lb

## 2016-12-15 DIAGNOSIS — E781 Pure hyperglyceridemia: Secondary | ICD-10-CM | POA: Diagnosis not present

## 2016-12-15 DIAGNOSIS — I10 Essential (primary) hypertension: Secondary | ICD-10-CM

## 2016-12-15 LAB — COMPLETE METABOLIC PANEL WITH GFR
ALBUMIN: 4.6 g/dL (ref 3.6–5.1)
ALT: 14 U/L (ref 6–29)
AST: 14 U/L (ref 10–30)
Alkaline Phosphatase: 48 U/L (ref 33–115)
BILIRUBIN TOTAL: 0.8 mg/dL (ref 0.2–1.2)
BUN: 10 mg/dL (ref 7–25)
CALCIUM: 9.9 mg/dL (ref 8.6–10.2)
CO2: 24 mmol/L (ref 20–31)
CREATININE: 0.74 mg/dL (ref 0.50–1.10)
Chloride: 107 mmol/L (ref 98–110)
GFR, Est Non African American: >89 mL/min (ref >=60)
Glucose, Bld: 103 mg/dL — ABNORMAL HIGH (ref 65–99)
POTASSIUM: 4.5 mmol/L (ref 3.5–5.3)
Sodium: 139 mmol/L (ref 135–146)
Total Protein: 7.6 g/dL (ref 6.1–8.1)

## 2016-12-15 LAB — LIPID PANEL
CHOL/HDL RATIO: 7.4 ratio — AB (ref <5.0)
CHOLESTEROL: 186 mg/dL (ref <200)
HDL: 25 mg/dL — AB (ref >50)
LDL Cholesterol: 118 mg/dL — ABNORMAL HIGH (ref <100)
TRIGLYCERIDES: 216 mg/dL — AB (ref <150)
VLDL: 43 mg/dL — AB (ref <30)

## 2016-12-15 MED ORDER — LABETALOL HCL 100 MG PO TABS: 100.0000 mg | ORAL_TABLET | Freq: Two times a day (BID) | ORAL | 1 refills | Status: DC

## 2016-12-15 NOTE — Progress Notes (Addendum)
Name: Patricia Duarte   MRN: [id number]    DOB: 05/26/1991   Date:12/15/2016       Progress Note  Subjective  Chief Complaint  Chief Complaint  Patient presents with  . Hypertension    been out of meds for 1 month    Hypertension  This is a chronic problem. The problem has been gradually worsening since onset. The problem is uncontrolled. Pertinent negatives include no blurred vision, chest pain, headaches or palpitations. Past treatments include beta blockers. Compliance problems include medication cost (not taking Labetalol for one month becasue ran out of prescription.).  There is no history of kidney disease, CAD/MI or CVA.  Hyperlipidemia  This is a chronic problem. The problem is uncontrolled. Recent lipid tests were reviewed and are high (Elevated Triglycerides and LDL, below normal HDL.). Exacerbating diseases include obesity. Pertinent negatives include no chest pain. She is currently on no antihyperlipidemic treatment.     Past Medical History:  Diagnosis Date  . Asthma     Past Surgical History:  Procedure Laterality Date  . CHOLECYSTECTOMY  2008    Family History  Problem Relation Age of Onset  . Hypertension Mother   . Hypertension Brother   . Diabetes Maternal Grandmother   . Hypertension Maternal Grandmother   . Diabetes Maternal Grandfather   . Cancer Maternal Grandfather     bladder  . Hypertension Maternal Grandfather     Social History   Social History  . Marital status: Single    Spouse name: N/A  . Number of children: N/A  . Years of education: N/A   Occupational History  . Not on file.   Social History Main Topics  . Smoking status: Former Games developer  . Smokeless tobacco: Never Used  . Alcohol use No  . Drug use: No  . Sexual activity: Yes    Partners: Male   Other Topics Concern  . Not on file   Social History Narrative  . No narrative on file     Current Outpatient Prescriptions:  .  albuterol (PROAIR HFA) 108 (90 BASE) MCG/ACT  inhaler, Inhale into the lungs., Disp: , Rfl:  .  labetalol (NORMODYNE) 100 MG tablet, TAKE ONE TABLET BY MOUTH TWICE DAILY, Disp: 60 tablet, Rfl: 2  No Known Allergies   Review of Systems  Eyes: Negative for blurred vision.  Cardiovascular: Negative for chest pain and palpitations.  Neurological: Negative for headaches.    Objective  Vitals:   12/15/16 1118  BP: (!) 130/96  Pulse: 88  Resp: 16  Temp: 98.1 F (36.7 C)  TempSrc: Oral  SpO2: 99%  Weight: 207 lb 14.4 oz (94.3 kg)  Height: 5\' 7"  (1.702 m)    Physical Exam  Constitutional: She is oriented to person, place, and time and well-developed, well-nourished, and in no distress.  HENT:  Head: Normocephalic and atraumatic.  Cardiovascular: Normal rate, regular rhythm and normal heart sounds.   No murmur heard. Pulmonary/Chest: Effort normal and breath sounds normal. She has no wheezes.  Abdominal: Soft. Bowel sounds are normal.  Musculoskeletal: She exhibits no edema.  Neurological: She is alert and oriented to person, place, and time.  Psychiatric: Mood, memory, affect and judgment normal.  Nursing note and vitals reviewed.       Assessment & Plan  1. Benign hypertension BP elevated as patient has not taken her meds for over a month, refills provided and follow-up in 3 months - labetalol (NORMODYNE) 100 MG tablet; Take 1 tablet (100  mg total) by mouth 2 (two) times daily.  Dispense: 180 tablet; Refill: 1  2. Hypertriglyceridemia  - COMPLETE METABOLIC PANEL WITH GFR - Lipid panel   Patricia Duarte Patricia Duarte [facility] 12/15/2016 11:38 AM

## 2017-03-17 ENCOUNTER — Ambulatory Visit (INDEPENDENT_AMBULATORY_CARE_PROVIDER_SITE_OTHER): Payer: Medicaid Other | Admitting: Family Medicine

## 2017-03-17 ENCOUNTER — Encounter: Payer: Self-pay | Admitting: Family Medicine

## 2017-03-17 VITALS — BP 128/79 | HR 86 | Temp 98.2°F | Resp 16 | Ht 67.0 in | Wt 208.6 lb

## 2017-03-17 DIAGNOSIS — I10 Essential (primary) hypertension: Secondary | ICD-10-CM | POA: Diagnosis not present

## 2017-03-17 DIAGNOSIS — E781 Pure hyperglyceridemia: Secondary | ICD-10-CM | POA: Diagnosis not present

## 2017-03-17 DIAGNOSIS — R739 Hyperglycemia, unspecified: Secondary | ICD-10-CM

## 2017-03-17 LAB — LIPID PANEL
CHOLESTEROL: 161 mg/dL (ref <200)
HDL: 25 mg/dL — ABNORMAL LOW (ref >50)
LDL CALC: 99 mg/dL (ref <100)
TRIGLYCERIDES: 185 mg/dL — AB (ref <150)
Total CHOL/HDL Ratio: 6.4 Ratio — ABNORMAL HIGH (ref <5.0)
VLDL: 37 mg/dL — ABNORMAL HIGH (ref <30)

## 2017-03-17 LAB — POCT GLYCOSYLATED HEMOGLOBIN (HGB A1C): Hemoglobin A1C: 5.4

## 2017-03-17 MED ORDER — LABETALOL HCL 100 MG PO TABS: 100.0000 mg | ORAL_TABLET | Freq: Two times a day (BID) | ORAL | 1 refills | Status: DC

## 2017-03-17 NOTE — Progress Notes (Signed)
Name: Patricia Duarte   MRN: [id number]    DOB: 18-Dec-1990   Date:03/17/2017       Progress Note  Subjective  Chief Complaint  Chief Complaint  Patient presents with  . Follow-up    3 mo  . Medication Refill    Hyperlipidemia  This is a chronic problem. The problem is uncontrolled. Recent lipid tests were reviewed and are high. Exacerbating diseases include obesity. Pertinent negatives include no chest pain or shortness of breath. Current antihyperlipidemic treatment includes diet change and exercise. Risk factors for coronary artery disease include dyslipidemia.  Hypertension  This is a chronic problem. The problem is unchanged. The problem is controlled. Pertinent negatives include no blurred vision, chest pain, headaches, palpitations or shortness of breath. Risk factors for coronary artery disease include dyslipidemia. Past treatments include beta blockers. There is no history of kidney disease, CAD/MI or CVA.      Past Medical History:  Diagnosis Date  . Asthma     Past Surgical History:  Procedure Laterality Date  . CHOLECYSTECTOMY  2008    Family History  Problem Relation Age of Onset  . Hypertension Mother   . Hypertension Brother   . Diabetes Maternal Grandmother   . Hypertension Maternal Grandmother   . Diabetes Maternal Grandfather   . Cancer Maternal Grandfather        bladder  . Hypertension Maternal Grandfather     Social History   Social History  . Marital status: Single    Spouse name: N/A  . Number of children: N/A  . Years of education: N/A   Occupational History  . Not on file.   Social History Main Topics  . Smoking status: Former Games developermoker  . Smokeless tobacco: Never Used  . Alcohol use No  . Drug use: No  . Sexual activity: Yes    Partners: Male   Other Topics Concern  . Not on file   Social History Narrative  . No narrative on file     Current Outpatient Prescriptions:  .  albuterol (PROAIR HFA) 108 (90 BASE) MCG/ACT inhaler,  Inhale into the lungs., Disp: , Rfl:  .  labetalol (NORMODYNE) 100 MG tablet, Take 1 tablet (100 mg total) by mouth 2 (two) times daily., Disp: 180 tablet, Rfl: 1  No Known Allergies   Review of Systems  Eyes: Negative for blurred vision.  Respiratory: Negative for shortness of breath.   Cardiovascular: Negative for chest pain and palpitations.  Neurological: Negative for headaches.     Objective  Vitals:   03/17/17 0948  BP: 128/79  Pulse: 86  Resp: 16  Temp: 98.2 F (36.8 C)  TempSrc: Oral  SpO2: 98%  Weight: 208 lb 9.6 oz (94.6 kg)  Height: 5\' 7"  (1.702 m)    Physical Exam  Constitutional: She is oriented to person, place, and time and well-developed, well-nourished, and in no distress.  HENT:  Head: Normocephalic and atraumatic.  Cardiovascular: Normal rate, regular rhythm and normal heart sounds.   No murmur heard. Pulmonary/Chest: Effort normal and breath sounds normal. She has no wheezes.  Abdominal: Soft. Bowel sounds are normal. There is no tenderness.  Neurological: She is alert and oriented to person, place, and time.  Psychiatric: Mood, memory, affect and judgment normal.  Nursing note and vitals reviewed.       Assessment & Plan  1. Benign hypertension BP stable on present and hypertensive therapy - labetalol (NORMODYNE) 100 MG tablet; Take 1 tablet (100 mg total) by  mouth 2 (two) times daily.  Dispense: 180 tablet; Refill: 1  2. Hypertriglyceridemia Obtaining FLP and adjust statin as appropriate - Lipid panel  3. Hyperglycemia Point of care A1c is 5.4%, considered normal - POCT glycosylated hemoglobin (Hb A1C)   Patricia Duarte [facility] 03/17/2017 10:00 AM

## 2017-04-26 ENCOUNTER — Encounter: Payer: Self-pay | Admitting: Family Medicine

## 2017-04-26 ENCOUNTER — Ambulatory Visit (INDEPENDENT_AMBULATORY_CARE_PROVIDER_SITE_OTHER): Payer: Medicaid Other | Admitting: Family Medicine

## 2017-04-26 VITALS — BP 130/81 | HR 103 | Temp 98.1°F | Resp 17 | Ht 67.0 in | Wt 212.3 lb

## 2017-04-26 DIAGNOSIS — R1031 Right lower quadrant pain: Secondary | ICD-10-CM

## 2017-04-26 LAB — CBC WITH DIFFERENTIAL/PLATELET
BASOS ABS: 0 {cells}/uL (ref 0–200)
Basophils Relative: 0 %
EOS ABS: 272 {cells}/uL (ref 15–500)
Eosinophils Relative: 4 %
HEMATOCRIT: 42.9 % (ref 35.0–45.0)
HEMOGLOBIN: 14.3 g/dL (ref 11.7–15.5)
LYMPHS ABS: 2176 {cells}/uL (ref 850–3900)
LYMPHS PCT: 32 %
MCH: 30.4 pg (ref 27.0–33.0)
MCHC: 33.3 g/dL (ref 32.0–36.0)
MCV: 91.1 fL (ref 80.0–100.0)
MONO ABS: 340 {cells}/uL (ref 200–950)
MPV: 9.7 fL (ref 7.5–12.5)
Monocytes Relative: 5 %
NEUTROS PCT: 59 %
Neutro Abs: 4012 cells/uL (ref 1500–7800)
Platelets: 299 10*3/uL (ref 140–400)
RBC: 4.71 MIL/uL (ref 3.80–5.10)
RDW: 13.6 % (ref 11.0–15.0)
WBC: 6.8 10*3/uL (ref 3.8–10.8)

## 2017-04-26 LAB — POCT URINALYSIS DIPSTICK
BILIRUBIN UA: NEGATIVE
Blood, UA: NEGATIVE
GLUCOSE UA: NEGATIVE
KETONES UA: NEGATIVE
Leukocytes, UA: NEGATIVE
NITRITE UA: NEGATIVE
PH UA: 5 (ref 5.0–8.0)
Protein, UA: NEGATIVE
SPEC GRAV UA: 1.02 (ref 1.010–1.025)
Urobilinogen, UA: NEGATIVE E.U./dL — AB

## 2017-04-26 LAB — POCT URINE PREGNANCY: Preg Test, Ur: NEGATIVE

## 2017-04-26 NOTE — Progress Notes (Signed)
Name: Patricia MillardRegan C May [name]   MRN: [id number]    DOB: [birth year]   Date:04/26/2017       Progress Note  Subjective  Chief Complaint  Chief Complaint  Patient presents with  . Abdominal Cramping    Abdominal Cramping  This is a recurrent problem. The current episode started more than 1 month ago (more than 4 months ago). The onset quality is gradual. The pain is located in the suprapubic region. The pain is at a severity of 5/10. The pain is moderate. The quality of the pain is cramping. The abdominal pain does not radiate. Pertinent negatives include no belching, constipation, diarrhea, fever, flatus, frequency, hematuria, nausea or vomiting. Treatments tried: Ibuoprofen. The treatment provided moderate relief.     Past Medical History:  Diagnosis Date  . Asthma     Past Surgical History:  Procedure Laterality Date  . CHOLECYSTECTOMY  2008    Family History  Problem Relation Age of Onset  . Hypertension Mother   . Hypertension Brother   . Diabetes Maternal Grandmother   . Hypertension Maternal Grandmother   . Diabetes Maternal Grandfather   . Cancer Maternal Grandfather        bladder  . Hypertension Maternal Grandfather     Social History   Social History  . Marital status: Single    Spouse name: N/A  . Number of children: N/A  . Years of education: N/A   Occupational History  . Not on file.   Social History Main Topics  . Smoking status: Former Games developermoker  . Smokeless tobacco: Never Used  . Alcohol use No  . Drug use: No  . Sexual activity: Yes    Partners: Male   Other Topics Concern  . Not on file   Social History Narrative  . No narrative on file     Current Outpatient Prescriptions:  .  albuterol (PROAIR HFA) 108 (90 BASE) MCG/ACT inhaler, Inhale into the lungs., Disp: , Rfl:  .  labetalol (NORMODYNE) 100 MG tablet, Take 1 tablet (100 mg total) by mouth 2 (two) times daily., Disp: 180 tablet, Rfl: 1  No Known Allergies   Review of Systems    Constitutional: Negative for fever.  Gastrointestinal: Negative for constipation, diarrhea, flatus, nausea and vomiting.  Genitourinary: Negative for frequency and hematuria.     Objective  Vitals:   04/26/17 1338  BP: 130/81  Pulse: (!) 103  Resp: 17  Temp: 98.1 F (36.7 C)  TempSrc: Oral  SpO2: 99%  Weight: 212 lb 4.8 oz (96.3 kg)  Height: 5\' 7"  (1.702 m)    Physical Exam  Constitutional: She is oriented to person, place, and time and well-developed, well-nourished, and in no distress.  Cardiovascular: Normal rate, regular rhythm and normal heart sounds.   No murmur heard. Pulmonary/Chest: Effort normal and breath sounds normal. She has no wheezes.  Abdominal: Soft. Bowel sounds are normal. There is tenderness in the right lower quadrant and periumbilical area. There is no rebound and no CVA tenderness.    Neurological: She is alert and oriented to person, place, and time.  Nursing note and vitals reviewed.    Recent Results (from the past 2160 hour(s))  Lipid panel     Status: Abnormal   Collection Time: 03/17/17 10:28 AM  Result Value Ref Range   Cholesterol 161 <200 mg/dL   Triglycerides 409185 (H) <150 mg/dL   HDL 25 (L) >81>50 mg/dL   Total CHOL/HDL Ratio 6.4 (H) <5.0 Ratio   VLDL  37 (H) <30 mg/dL   LDL Cholesterol 99 <409 mg/dL  POCT glycosylated hemoglobin (Hb A1C)     Status: Normal   Collection Time: 03/17/17 10:36 AM  Result Value Ref Range   Hemoglobin A1C 5.4      Assessment & Plan  1. RLQ abdominal pain Unclear etiology, patient has no menstrual cycle because she regularly gets Depo-Provera, urinalysis and urine pregnancy is negative, obtain pertinent lab work and obtained CT scan abdomen and pelvis with and without contrast, qualitative beta-hCG - POCT Urinalysis Dipstick - POCT urine pregnancy - CBC with Differential/Platelet - COMPLETE METABOLIC PANEL WITH GFR - CT Abdomen Pelvis W Contrast; Future - CT Abdomen Pelvis Wo Contrast; Future -  hCG, serum, qualitative   Hershall Benkert [name] 04/26/2017 2:11 PM

## 2017-04-27 ENCOUNTER — Telehealth: Payer: Self-pay

## 2017-04-27 LAB — COMPLETE METABOLIC PANEL WITH GFR
ALBUMIN: 4.5 g/dL (ref 3.6–5.1)
ALK PHOS: 51 U/L (ref 33–115)
ALT: 15 U/L (ref 6–29)
AST: 18 U/L (ref 10–30)
BUN: 8 mg/dL (ref 7–25)
CALCIUM: 9.8 mg/dL (ref 8.6–10.2)
CO2: 23 mmol/L (ref 20–31)
CREATININE: 0.84 mg/dL (ref 0.50–1.10)
Chloride: 106 mmol/L (ref 98–110)
GFR, Est Non African American: >89 mL/min (ref >=60)
Glucose, Bld: 73 mg/dL (ref 65–99)
POTASSIUM: 4.1 mmol/L (ref 3.5–5.3)
Sodium: 141 mmol/L (ref 135–146)
Total Bilirubin: 0.6 mg/dL (ref 0.2–1.2)
Total Protein: 7.2 g/dL (ref 6.1–8.1)

## 2017-04-27 LAB — HCG, SERUM, QUALITATIVE: PREG SERUM: NEGATIVE

## 2017-04-27 NOTE — Telephone Encounter (Signed)
Patient was informed that she has been scheduled to have her CT on Thursday, May 05, 2017 @ 9am at the [facility]. Patient was instructed to pick up her prep kit the day before, arrive 15 mins early and not to have anything to eat or drink after midnight.  Patient express verbal understanding.

## 2017-04-27 NOTE — Addendum Note (Signed)
Addended by: Idelle CrouchICHMOND, LATISHA A on: 04/27/2017 08:57 AM   Modules accepted: Orders

## 2017-05-05 ENCOUNTER — Ambulatory Visit
Admission: RE | Admit: 2017-05-05 | Discharge: 2017-05-05 | Disposition: A | Discharge status: Home / Self Care | Payer: Medicaid Other | Source: Ambulatory Visit | Attending: Family Medicine | Admitting: Family Medicine

## 2017-05-05 DIAGNOSIS — R1031 Right lower quadrant pain: Secondary | ICD-10-CM | POA: Diagnosis present

## 2017-05-05 HISTORY — DX: Essential (primary) hypertension: I10

## 2017-05-05 MED ORDER — IOPAMIDOL (ISOVUE-300) INJECTION 61%
100.0000 mL | Freq: Once | INTRAVENOUS | Status: AC | PRN
  Administered 2017-05-05: 100 mL via INTRAVENOUS

## 2017-05-07 ENCOUNTER — Other Ambulatory Visit: Payer: Self-pay | Admitting: Family Medicine

## 2017-05-07 DIAGNOSIS — R1031 Right lower quadrant pain: Secondary | ICD-10-CM

## 2017-06-17 ENCOUNTER — Ambulatory Visit: Payer: Medicaid Other | Admitting: Family Medicine

## 2017-07-27 ENCOUNTER — Encounter: Payer: Self-pay | Admitting: Obstetrics and Gynecology

## 2017-08-23 ENCOUNTER — Emergency Department
Admission: EM | Admit: 2017-08-23 | Discharge: 2017-08-23 | Disposition: A | Discharge status: Home / Self Care | Payer: Self-pay | Attending: Emergency Medicine | Admitting: Emergency Medicine

## 2017-08-23 ENCOUNTER — Encounter: Payer: Self-pay | Admitting: Emergency Medicine

## 2017-08-23 DIAGNOSIS — I10 Essential (primary) hypertension: Secondary | ICD-10-CM | POA: Insufficient documentation

## 2017-08-23 DIAGNOSIS — Z87891 Personal history of nicotine dependence: Secondary | ICD-10-CM | POA: Insufficient documentation

## 2017-08-23 DIAGNOSIS — R05 Cough: Secondary | ICD-10-CM | POA: Insufficient documentation

## 2017-08-23 DIAGNOSIS — J0141 Acute recurrent pansinusitis: Secondary | ICD-10-CM | POA: Insufficient documentation

## 2017-08-23 DIAGNOSIS — J45909 Unspecified asthma, uncomplicated: Secondary | ICD-10-CM | POA: Insufficient documentation

## 2017-08-23 DIAGNOSIS — Z79899 Other long term (current) drug therapy: Secondary | ICD-10-CM | POA: Insufficient documentation

## 2017-08-23 MED ORDER — AMOXICILLIN-POT CLAVULANATE 875-125 MG PO TABS: 1.0000 | ORAL_TABLET | Freq: Two times a day (BID) | ORAL | 0 refills | Status: AC

## 2017-08-23 MED ORDER — PREDNISONE 10 MG PO TABS: ORAL_TABLET | ORAL | 0 refills | Status: DC

## 2017-08-23 NOTE — Discharge Instructions (Signed)
Follow-up with Dr. Jenne CampusMcQueen if any continued problems with her sinuses. Begin taking antibiotics as directed. Prednisone as directed.

## 2017-08-23 NOTE — ED Provider Notes (Signed)
[facility] Emergency Department Provider Note  ____________________________________________   First MD Initiated Contact with Patient 08/23/17 1258     (approximate)  I have reviewed the triage vital signs and the nursing notes.   HISTORY  Chief Complaint Nasal Congestion   HPI Patricia Duarte is a 26 y.o. female is here with complaint of nasal congestion, drainage and cough for 2 weeks. Patient is unaware of any fever. She has been taking Sudafed for congestion without any relief. Patient has a history of sinus infections and also had bronchitis once. Patient states that she is a former smoker.she also has had a history of hypertension that currently is not taking any medication. She rates her pain as 4 out of 10.   Past Medical History:  Diagnosis Date  . Asthma   . Hypertension     Patient Active Problem List   Diagnosis Date Noted  . Hyperglycemia 03/17/2017  . Hypertriglyceridemia 04/20/2016  . Obesity, Class II, BMI 35-39.9 11/04/2015  . Acute pain of left wrist 11/04/2015  . Dermatitis 08/04/2015  . Contraception management 08/04/2015  . Allergic rhinitis 08/04/2015  . Migraine headache without aura 05/02/2015  . Nonintractable episodic headache 05/02/2015  . Benign hypertension 05/02/2015    Past Surgical History:  Procedure Laterality Date  . CHOLECYSTECTOMY  2008    Prior to Admission medications   Medication Sig Start Date End Date Taking? Authorizing Provider  albuterol (PROAIR HFA) 108 (90 BASE) MCG/ACT inhaler Inhale into the lungs.    [provider]  amoxicillin-clavulanate (AUGMENTIN) 875-125 MG tablet Take 1 tablet by mouth 2 (two) times daily for 7 days. 08/23/17 08/30/17  Tommi RumpsSummers, Cullen Vanallen L, PA-C  labetalol (NORMODYNE) 100 MG tablet Take 1 tablet (100 mg total) by mouth 2 (two) times daily. 03/17/17   Ellyn HackShah, Syed Asad A, MD  predniSONE (DELTASONE) 10 MG tablet Take 6 tablets  today, on day 2 take 5 tablets, day  3 take 4 tablets, day 4 take 3 tablets, day 5 take  2 tablets and 1 tablet the last day 08/23/17   Tommi RumpsSummers, Daeshawn Redmann L, PA-C    Allergies Patient has no known allergies.  Family History  Problem Relation Age of Onset  . Hypertension Mother   . Hypertension Brother   . Diabetes Maternal Grandmother   . Hypertension Maternal Grandmother   . Diabetes Maternal Grandfather   . Cancer Maternal Grandfather        bladder  . Hypertension Maternal Grandfather     Social History Social History   Tobacco Use  . Smoking status: Former Games developermoker  . Smokeless tobacco: Never Used  Substance Use Topics  . Alcohol use: No    Alcohol/week: 0.0 oz  . Drug use: No    Review of Systems Constitutional: No fever/chills Eyes: No visual changes. ENT: No sore throat. Negative ear pain. Positive sinus pain. Cardiovascular: Denies chest pain. Respiratory: Denies shortness of breath. Gastrointestinal:   No nausea, no vomiting.  Musculoskeletal: Negative for back pain. Skin: Negative for rash. Neurological: positive for headache, negative for focal weakness or numbness. ____________________________________________   PHYSICAL EXAM:  VITAL SIGNS: ED Triage Vitals  Enc Vitals Group     BP 08/23/17 1249 (!) 150/101     Pulse Rate 08/23/17 1249 96     Resp 08/23/17 1249 16     Temp 08/23/17 1249 98.8 F (37.1 C)     Temp Source 08/23/17 1249 Oral     SpO2 08/23/17 1249 98 %  Weight 08/23/17 1250 210 lb (95.3 kg)     Height 08/23/17 1250 5\' 7"  (1.702 m)     Head Circumference --      Peak Flow --      Pain Score 08/23/17 1249 4     Pain Loc --      Pain Edu? --      Excl. in GC? --    Constitutional: Alert and oriented. Well appearing and in no acute distress. Eyes: Conjunctivae are normal. PERRL. EOMI. Head: Atraumatic. Nose: moderate congestion/rhinnorhea. EACs are clear. TMs are dull but without erythema or injection.  Moderate tenderness to frontal and maxillary sinuses on  percussion. Mouth/Throat: Mucous membranes are moist.  Oropharynx non-erythematous. Moderate posterior drainage. Neck: No stridor.   Hematological/Lymphatic/Immunilogical: No cervical lymphadenopathy. Cardiovascular: Normal rate, regular rhythm. Grossly normal heart sounds.  Good peripheral circulation. Respiratory: Normal respiratory effort.  No retractions. Lungs CTAB. Musculoskeletal: moves upper and lower extremities without any difficulty. Normal gait was noted. Neurologic:  Normal speech and language. No gross focal neurologic deficits are appreciated. Skin:  Skin is warm, dry and intact. No rash noted. Psychiatric: Mood and affect are normal. Speech and behavior are normal.  ____________________________________________   LABS (all labs ordered are listed, but only abnormal results are displayed)  Labs Reviewed - No data to display  PROCEDURES  Procedure(s) performed: None  Procedures  Critical Care performed: No  ____________________________________________   INITIAL IMPRESSION / ASSESSMENT AND PLAN / ED COURSE The patient was started on Augmentin 875 twice a day for 10 days, prednisone tapering dose starting with 60 mg. She is also given the name of Dr. Jenne CampusMcQueen who is on-call for ENT to follow-up with should she continue to have sinus infections. She'll increase fluids. Tylenol or ibuprofen as needed.we also discussed discontinue taking the Sudafed as this may have picked increased her blood pressure and she should see her PCP to have a pressure rechecked.  ____________________________________________   FINAL CLINICAL IMPRESSION(S) / ED DIAGNOSES  Final diagnoses:  Acute recurrent pansinusitis     ED Discharge Orders        Ordered    amoxicillin-clavulanate (AUGMENTIN) 875-125 MG tablet  2 times daily     08/23/17 1341    predniSONE (DELTASONE) 10 MG tablet     08/23/17 1341       Note:  This document was prepared using Dragon voice recognition software  and may include unintentional dictation errors.    Tommi RumpsSummers, Tosh Glaze L, PA-C 08/23/17 1514    Arnaldo NatalMalinda, Paul F, MD 08/23/17 [phone]

## 2017-08-23 NOTE — ED Triage Notes (Signed)
Patient presents to ED via POV from home with c/o nasal congestion x 2 weeks.

## 2017-08-23 NOTE — ED Notes (Signed)
Provider aware of HTN, pt in NAD at d/c, pt ambulatory, verbalizes d/c teaching and rx.

## 2017-08-23 NOTE — ED Notes (Signed)
Patient presents to the ED with nasal congestion x 2 weeks with headache and dry cough.  Patient states, "I'm pretty sure it's a sinus infection.  I get these pretty often."  Patient reports feeling pain in her teeth. Patient is in no obvious distress at this time.  Denies fever.

## 2017-12-26 LAB — HM PAP SMEAR: HM Pap smear: NEGATIVE

## 2017-12-26 LAB — HM HIV SCREENING LAB: HM HIV Screening: NEGATIVE

## 2018-08-03 ENCOUNTER — Other Ambulatory Visit: Payer: Self-pay

## 2018-08-03 ENCOUNTER — Encounter: Payer: Self-pay | Admitting: Emergency Medicine

## 2018-08-03 ENCOUNTER — Emergency Department
Admission: EM | Admit: 2018-08-03 | Discharge: 2018-08-03 | Disposition: A | Discharge status: Home / Self Care | Payer: Self-pay | Attending: Emergency Medicine | Admitting: Emergency Medicine

## 2018-08-03 DIAGNOSIS — I10 Essential (primary) hypertension: Secondary | ICD-10-CM | POA: Insufficient documentation

## 2018-08-03 DIAGNOSIS — Z87891 Personal history of nicotine dependence: Secondary | ICD-10-CM | POA: Insufficient documentation

## 2018-08-03 DIAGNOSIS — J028 Acute pharyngitis due to other specified organisms: Secondary | ICD-10-CM

## 2018-08-03 DIAGNOSIS — J45909 Unspecified asthma, uncomplicated: Secondary | ICD-10-CM | POA: Insufficient documentation

## 2018-08-03 DIAGNOSIS — Z79899 Other long term (current) drug therapy: Secondary | ICD-10-CM | POA: Insufficient documentation

## 2018-08-03 DIAGNOSIS — J0100 Acute maxillary sinusitis, unspecified: Secondary | ICD-10-CM | POA: Insufficient documentation

## 2018-08-03 MED ORDER — LIDOCAINE VISCOUS HCL 2 % MT SOLN: 5.0000 mL | Freq: Four times a day (QID) | OROMUCOSAL | 0 refills | Status: DC | PRN

## 2018-08-03 MED ORDER — FEXOFENADINE-PSEUDOEPHED ER 60-120 MG PO TB12: 1.0000 | ORAL_TABLET | Freq: Two times a day (BID) | ORAL | 0 refills | Status: DC

## 2018-08-03 MED ORDER — LABETALOL HCL 100 MG PO TABS: 100.0000 mg | ORAL_TABLET | Freq: Two times a day (BID) | ORAL | 11 refills | Status: DC

## 2018-08-03 MED ORDER — AMOXICILLIN 500 MG PO CAPS: 500.0000 mg | ORAL_CAPSULE | Freq: Three times a day (TID) | ORAL | 0 refills | Status: DC

## 2018-08-03 NOTE — ED Triage Notes (Signed)
Patient to ER for c/o facial pain and sore throat. States "I think I have a sinus infection.".

## 2018-08-03 NOTE — ED Provider Notes (Signed)
[facility] Emergency Department Provider Note   ____________________________________________   First MD Initiated Contact with Patient 08/03/18 [phone]     (approximate)  I have reviewed the triage vital signs and the nursing notes.   HISTORY  Chief Complaint Facial Pain and Sore Throat    HPI Patricia Duarte is a 27 y.o. female patient presents with facial pain sore throat.  Patient states sinus congestion for approximately 1 week.  Patient stated waking this morning with sore throat and edematous tonsils.  Patient denies fever.  Patient denies nausea, vomiting, diarrhea.  Past Medical History:  Diagnosis Date  . Asthma   . Hypertension     Patient Active Problem List   Diagnosis Date Noted  . Hyperglycemia 03/17/2017  . Hypertriglyceridemia 04/20/2016  . Obesity, Class II, BMI 35-39.9 11/04/2015  . Acute pain of left wrist 11/04/2015  . Dermatitis 08/04/2015  . Contraception management 08/04/2015  . Allergic rhinitis 08/04/2015  . Migraine headache without aura 05/02/2015  . Nonintractable episodic headache 05/02/2015  . Benign hypertension 05/02/2015    Past Surgical History:  Procedure Laterality Date  . CHOLECYSTECTOMY  2008    Prior to Admission medications   Medication Sig Start Date End Date Taking? Authorizing Provider  albuterol (PROAIR HFA) 108 (90 BASE) MCG/ACT inhaler Inhale into the lungs.    [provider]  amoxicillin (AMOXIL) 500 MG capsule Take 1 capsule (500 mg total) by mouth 3 (three) times daily. 08/03/18   Joni Reining, PA-C  fexofenadine-pseudoephedrine (ALLEGRA-D) 60-120 MG 12 hr tablet Take 1 tablet by mouth 2 (two) times daily. 08/03/18   Joni Reining, PA-C  labetalol (NORMODYNE) 100 MG tablet Take 1 tablet (100 mg total) by mouth 2 (two) times daily. 03/17/17   Ellyn Hack, MD  labetalol (NORMODYNE) 100 MG tablet Take 1 tablet (100 mg total) by mouth 2 (two) times daily. 08/03/18   Joni Reining, PA-C  lidocaine (XYLOCAINE) 2 % solution Use as directed 5 mLs in the mouth or throat every 6 (six) hours as needed for mouth pain. 08/03/18   Joni Reining, PA-C  predniSONE (DELTASONE) 10 MG tablet Take 6 tablets  today, on day 2 take 5 tablets, day 3 take 4 tablets, day 4 take 3 tablets, day 5 take  2 tablets and 1 tablet the last day 08/23/17   Tommi Rumps, PA-C    Allergies Patient has no known allergies.  Family History  Problem Relation Age of Onset  . Hypertension Mother   . Hypertension Brother   . Diabetes Maternal Grandmother   . Hypertension Maternal Grandmother   . Diabetes Maternal Grandfather   . Cancer Maternal Grandfather        bladder  . Hypertension Maternal Grandfather     Social History Social History   Tobacco Use  . Smoking status: Former Games developer  . Smokeless tobacco: Never Used  Substance Use Topics  . Alcohol use: No    Alcohol/week: 0.0 standard drinks  . Drug use: No    Review of Systems Constitutional: No fever/chills Eyes: No visual changes. ENT: Sinus congestion and sore throat.  Cardiovascular: Denies chest pain. Respiratory: Denies shortness of breath. Gastrointestinal: No abdominal pain.  No nausea, no vomiting.  No diarrhea.  No constipation. Genitourinary: Negative for dysuria. Musculoskeletal: Negative for back pain. Skin: Negative for rash. Neurological: Negative for headaches, focal weakness or numbness. Endocrine:Hypertension.   ____________________________________________   PHYSICAL EXAM:  VITAL SIGNS:  ED Triage Vitals  Enc Vitals Group     BP 08/03/18 0811 (!) 153/97     Pulse Rate 08/03/18 0811 (!) 104     Resp 08/03/18 0811 20     Temp 08/03/18 0811 98.5 F (36.9 C)     Temp Source 08/03/18 0811 Oral     SpO2 08/03/18 0811 100 %     Weight 08/03/18 0812 225 lb (102.1 kg)     Height 08/03/18 0812 5\' 7"  (1.702 m)     Head Circumference --      Peak Flow --      Pain Score 08/03/18 0812 8      Pain Loc --      Pain Edu? --      Excl. in GC? --     Constitutional: Alert and oriented. Well appearing and in no acute distress. Nose: Bilateral maxillary guarding.  Edematous nasal turbinates with thick rhinorrhea. Mouth/Throat: Mucous membranes are moist.  Oropharynx erythematous.  Edematous but not exudative tonsils. Neck: No cervical spine tenderness to palpation. Hematological/Lymphatic/Immunilogical: Bilateral cervical lymphadenopathy. Cardiovascular: Normal rate, regular rhythm. Grossly normal heart sounds.  Good peripheral circulation. Respiratory: Normal respiratory effort.  No retractions. Lungs CTAB. Skin:  Skin is warm, dry and intact. No rash noted. ____________________________________________   LABS (all labs ordered are listed, but only abnormal results are displayed)  Labs Reviewed - No data to display ____________________________________________  EKG   ____________________________________________  RADIOLOGY  ED MD interpretation:    Official radiology report(s): No results found.  ____________________________________________   PROCEDURES  Procedure(s) performed: None  Procedures  Critical Care performed: No  ____________________________________________   INITIAL IMPRESSION / ASSESSMENT AND PLAN / ED COURSE  As part of my medical decision making, I reviewed the following data within the electronic MEDICAL RECORD NUMBER    Sinus congestion and sore throat secondary to subacute maxillary sinusitis and pharyngitis.  Patient given discharge care instruction advised take medication as directed.  Patient given a work note and advised to [facility].     ____________________________________________   FINAL CLINICAL IMPRESSION(S) / ED DIAGNOSES  Final diagnoses:  Subacute maxillary sinusitis  Acute pharyngitis due to other specified organisms     ED Discharge Orders         Ordered    amoxicillin (AMOXIL)  500 MG capsule  3 times daily     08/03/18 0825    fexofenadine-pseudoephedrine (ALLEGRA-D) 60-120 MG 12 hr tablet  2 times daily     08/03/18 0825    lidocaine (XYLOCAINE) 2 % solution  Every 6 hours PRN     08/03/18 0825    labetalol (NORMODYNE) 100 MG tablet  2 times daily     08/03/18 1610           Note:  This document was prepared using Dragon voice recognition software and may include unintentional dictation errors.    Joni Reining, PA-C 08/03/18 9604    Governor Rooks, MD 08/05/18 [phone]

## 2018-08-03 NOTE — ED Notes (Signed)
See triage note  Presents with  Some sinus pressure for couple of days  Woke up with sore throat this am   States feels like her throat is swollen  Throat is red and swollen on arrival   Afebrile on arrival

## 2019-04-23 ENCOUNTER — Encounter: Payer: Self-pay | Admitting: Physician Assistant

## 2019-04-23 ENCOUNTER — Other Ambulatory Visit: Payer: Self-pay

## 2019-04-23 ENCOUNTER — Ambulatory Visit: Payer: Self-pay | Admitting: Physician Assistant

## 2019-04-23 DIAGNOSIS — Z113 Encounter for screening for infections with a predominantly sexual mode of transmission: Secondary | ICD-10-CM

## 2019-04-23 LAB — WET PREP FOR TRICH, YEAST, CLUE
Trichomonas Exam: NEGATIVE
Yeast Exam: NEGATIVE

## 2019-04-23 NOTE — Progress Notes (Signed)
    STI clinic/screening visit  Subjective:  Patricia Duarte is a 28 y.o. female being seen today for an STI screening visit. The patient reports they do have symptoms.  Patient has the following medical conditionshas Migraine headache without aura; Nonintractable episodic headache; Benign hypertension; Dermatitis; Contraception management; Allergic rhinitis; Obesity, Class II, BMI 35-39.9; Acute pain of left wrist; Hypertriglyceridemia; and Hyperglycemia on their problem list.  Chief Complaint  Patient presents with  . SEXUALLY TRANSMITTED DISEASE    Patient reports that she has been having spotting a lot more than usual with the Depo.  Also, about 2 weeks ago her vaginal opening became irritated and she used OTC yeast treatment to relieve symptoms.  Patient reports that she found out that her partner may have other partner/s. HPI   See flowsheet for further details and programmatic requirements.    The following portions of the patient's history were reviewed and updated as appropriate: allergies, current medications, past family history, past medical history, past social history, past surgical history and problem list. Problem list updated.  Objective:  There were no vitals filed for this visit.  Physical Exam Constitutional:      General: She is not in acute distress.    Appearance: Normal appearance.  HENT:     Head: Normocephalic and atraumatic.     Mouth/Throat:     Mouth: Mucous membranes are moist.     Pharynx: Oropharynx is clear. No oropharyngeal exudate or posterior oropharyngeal erythema.  Neck:     Musculoskeletal: Neck supple.  Pulmonary:     Effort: Pulmonary effort is normal.  Abdominal:     Palpations: There is no mass.     Tenderness: There is no abdominal tenderness. There is no guarding or rebound.  Genitourinary:    General: Normal vulva.     Rectum: Normal.     Comments: External genitalia/pubic area without nits, lice, edema, erythema, lesions or  inguinal adenopathy. Vagina with normal mucosa and a small amount of brownish/bloody discharge. Cervix without visible lesions. Uterus normal size, firm, mobile, nt, no CMT, no masses, no adnexal tenderness or fullness. Lymphadenopathy:     Cervical: No cervical adenopathy.  Skin:    General: Skin is warm and dry.     Findings: No bruising, erythema, lesion or rash.  Neurological:     Mental Status: She is alert and oriented to person, place, and time.  Psychiatric:        Mood and Affect: Mood normal.        Behavior: Behavior normal.        Thought Content: Thought content normal.        Judgment: Judgment normal.       Assessment and Plan:  Patricia Duarte is a 28 y.o. female presenting to the [facility] Department for STI screening  1. Screening for STD (sexually transmitted disease) Patient with irregular spotting on Depo and counseled that this can be normal, especially if there are added stressors. Reviewed wet mount and no treatment needed today. Rec condoms with all sex Await test results.  Counseled that RN will call if she needs to RTC for any treatment once results are back.  - WET PREP FOR TRICH, YEAST, CLUE - Gonococcus culture - [facility] - Syphilis Serology, [facility]     No follow-ups on file.  No future appointments.  Jerene Dilling, PA

## 2019-04-23 NOTE — Progress Notes (Signed)
Patient here for STD screening.Jenetta Downer, RN   Patient wet mount reviewed, no treatment indicated at this time.Jenetta Downer, RN

## 2019-04-28 LAB — GONOCOCCUS CULTURE

## 2019-10-05 NOTE — L&D Delivery Note (Signed)
Delivery Note  First Stage: Labor onset: 1300 Augmentation: Cytotec, AROM, Pitocin Analgesia /Anesthesia intrapartum: epidural AROM at 1254  Second Stage: Complete dilation at 2034 Onset of pushing at 2040 FHR second stage Cat I tracing  Delivery of a viable female infant on 06/17/20 at 2044 by Eleri Ruben CNM; delivery of fetal head in LOA position with restitution to LOT. No nuchal cord;  Anterior then posterior shoulders delivered easily with gentle downward traction. Baby placed on mom's chest, and attended to by peds.  Cord double clamped after cessation of pulsation, cut by CNM Cord blood sample collected   Third Stage: Placenta delivered spontaneously intact with 3 VC @ 2049 Placenta disposition: routine disposal Uterine tone Firm / bleeding scant  Right labial laceration identified  Anesthesia for repair: epidural Repair single interrupted figure 8 stitch with 3-0 Vicryl SH for hemostasis and cosmesis.  Est. Blood Loss (mL): 50  Complications: none  Mom to postpartum.  Baby to Couplet care / Skin to Skin.  Newborn: Birth Weight: pending  Apgar Scores: 8/9 Feeding planned: breast

## 2019-10-30 LAB — OB RESULTS CONSOLE GC/CHLAMYDIA
Chlamydia: NEGATIVE
Gonorrhea: NEGATIVE

## 2019-12-07 ENCOUNTER — Emergency Department: Payer: 59

## 2019-12-07 ENCOUNTER — Other Ambulatory Visit: Payer: Self-pay

## 2019-12-07 ENCOUNTER — Encounter: Payer: Self-pay | Admitting: Emergency Medicine

## 2019-12-07 ENCOUNTER — Emergency Department
Admission: EM | Admit: 2019-12-07 | Discharge: 2019-12-07 | Disposition: A | Discharge status: Home / Self Care | Payer: 59 | Attending: Emergency Medicine | Admitting: Emergency Medicine

## 2019-12-07 DIAGNOSIS — N939 Abnormal uterine and vaginal bleeding, unspecified: Secondary | ICD-10-CM | POA: Diagnosis present

## 2019-12-07 DIAGNOSIS — O161 Unspecified maternal hypertension, first trimester: Secondary | ICD-10-CM | POA: Diagnosis not present

## 2019-12-07 DIAGNOSIS — O99511 Diseases of the respiratory system complicating pregnancy, first trimester: Secondary | ICD-10-CM | POA: Insufficient documentation

## 2019-12-07 DIAGNOSIS — Z3A10 10 weeks gestation of pregnancy: Secondary | ICD-10-CM | POA: Insufficient documentation

## 2019-12-07 DIAGNOSIS — O4691 Antepartum hemorrhage, unspecified, first trimester: Secondary | ICD-10-CM | POA: Insufficient documentation

## 2019-12-07 DIAGNOSIS — Z79899 Other long term (current) drug therapy: Secondary | ICD-10-CM | POA: Diagnosis not present

## 2019-12-07 DIAGNOSIS — Z87891 Personal history of nicotine dependence: Secondary | ICD-10-CM | POA: Insufficient documentation

## 2019-12-07 DIAGNOSIS — O4690 Antepartum hemorrhage, unspecified, unspecified trimester: Secondary | ICD-10-CM

## 2019-12-07 DIAGNOSIS — O418X10 Other specified disorders of amniotic fluid and membranes, first trimester, not applicable or unspecified: Secondary | ICD-10-CM

## 2019-12-07 DIAGNOSIS — J45909 Unspecified asthma, uncomplicated: Secondary | ICD-10-CM | POA: Insufficient documentation

## 2019-12-07 LAB — COMPREHENSIVE METABOLIC PANEL
ALT: 21 U/L (ref 0–44)
AST: 23 U/L (ref 15–41)
Albumin: 4.3 g/dL (ref 3.5–5.0)
Alkaline Phosphatase: 65 U/L (ref 38–126)
Anion gap: 11 (ref 5–15)
BUN: 8 mg/dL (ref 6–20)
CO2: 19 mmol/L — ABNORMAL LOW (ref 22–32)
Calcium: 9.5 mg/dL (ref 8.9–10.3)
Chloride: 104 mmol/L (ref 98–111)
Creatinine, Ser: 0.55 mg/dL (ref 0.44–1.00)
GFR calc Af Amer: >60 mL/min (ref >60)
GFR calc non Af Amer: >60 mL/min (ref >60)
Glucose, Bld: 96 mg/dL (ref 70–99)
Potassium: 3.8 mmol/L (ref 3.5–5.1)
Sodium: 134 mmol/L — ABNORMAL LOW (ref 135–145)
Total Bilirubin: 0.9 mg/dL (ref 0.3–1.2)
Total Protein: 7.7 g/dL (ref 6.5–8.1)

## 2019-12-07 LAB — CBC
HCT: 39.8 % (ref 36.0–46.0)
Hemoglobin: 13.8 g/dL (ref 12.0–15.0)
MCH: 30.7 pg (ref 26.0–34.0)
MCHC: 34.7 g/dL (ref 30.0–36.0)
MCV: 88.6 fL (ref 80.0–100.0)
Platelets: 308 10*3/uL (ref 150–400)
RBC: 4.49 MIL/uL (ref 3.87–5.11)
RDW: 12.3 % (ref 11.5–15.5)
WBC: 11.3 10*3/uL — ABNORMAL HIGH (ref 4.0–10.5)
nRBC: 0 % (ref 0.0–0.2)

## 2019-12-07 LAB — HCG, QUANTITATIVE, PREGNANCY: hCG, Beta Chain, Quant, S: 161252 m[IU]/mL — ABNORMAL HIGH (ref <5)

## 2019-12-07 LAB — POCT PREGNANCY, URINE: Preg Test, Ur: POSITIVE — AB

## 2019-12-07 LAB — ABO/RH: ABO/RH(D): O POS

## 2019-12-07 NOTE — ED Provider Notes (Signed)
[facility] Emergency Department Provider Note  ____________________________________________  Time seen: Approximately 4:57 PM  I have reviewed the triage vital signs and the nursing notes.   HISTORY  Chief Complaint Vaginal Bleeding    HPI Patricia Duarte May Laveda Norman is a 29 y.o. female who presents the emergency department complaining of vaginal bleeding during pregnancy.  Patient reports that she is [redacted] weeks pregnant, developed some vaginal bleeding today.  She states that she felt a small amount of drainage while at work, went to the bathroom and noticed blood.  She states that she has had increased vaginal discharge throughout her pregnancy, thought this was the same until she had seen blood.  No flank pain.  No dysuria, polyuria, hematuria.  Patient denies any pelvic pain.  Again, patient is at [redacted] weeks pregnant.  G2 P 1.  Patient denies any other complaints to include URI symptoms, chest pain, abdominal pain, nausea vomiting, diarrhea or constipation.         Past Medical History:  Diagnosis Date  . Asthma   . Hypertension     Patient Active Problem List   Diagnosis Date Noted  . Hyperglycemia 03/17/2017  . Hypertriglyceridemia 04/20/2016  . Obesity, Class II, BMI 35-39.9 11/04/2015  . Acute pain of left wrist 11/04/2015  . Dermatitis 08/04/2015  . Contraception management 08/04/2015  . Allergic rhinitis 08/04/2015  . Migraine headache without aura 05/02/2015  . Nonintractable episodic headache 05/02/2015  . Benign hypertension 05/02/2015    Past Surgical History:  Procedure Laterality Date  . CHOLECYSTECTOMY  2008    Prior to Admission medications   Medication Sig Start Date End Date Taking? Authorizing Provider  albuterol (PROAIR HFA) 108 (90 BASE) MCG/ACT inhaler Inhale into the lungs.    [provider]  amoxicillin (AMOXIL) 500 MG capsule Take 1 capsule (500 mg total) by mouth 3 (three) times daily. 08/03/18   Joni Reining, PA-C   fexofenadine-pseudoephedrine (ALLEGRA-D) 60-120 MG 12 hr tablet Take 1 tablet by mouth 2 (two) times daily. 08/03/18   Joni Reining, PA-C  labetalol (NORMODYNE) 100 MG tablet Take 1 tablet (100 mg total) by mouth 2 (two) times daily. 03/17/17   Ellyn Hack, MD  labetalol (NORMODYNE) 100 MG tablet Take 1 tablet (100 mg total) by mouth 2 (two) times daily. 08/03/18   Joni Reining, PA-C  lidocaine (XYLOCAINE) 2 % solution Use as directed 5 mLs in the mouth or throat every 6 (six) hours as needed for mouth pain. 08/03/18   Joni Reining, PA-C  predniSONE (DELTASONE) 10 MG tablet Take 6 tablets  today, on day 2 take 5 tablets, day 3 take 4 tablets, day 4 take 3 tablets, day 5 take  2 tablets and 1 tablet the last day 08/23/17   Tommi Rumps, PA-C    Allergies Patient has no known allergies.  Family History  Problem Relation Age of Onset  . Hypertension Mother   . Anxiety disorder Mother   . Hypertension Brother   . Diabetes Maternal Grandmother   . Hypertension Maternal Grandmother   . Hyperlipidemia Maternal Grandmother   . Diabetes Maternal Grandfather   . Cancer Maternal Grandfather        bladder  . Hypertension Maternal Grandfather   . Cirrhosis Father   . Drug abuse Father   . Alcohol abuse Father   . Heart attack Paternal Grandmother     Social History Social History   Tobacco Use  . Smoking  status: Former Smoker    Packs/day: 0.50    Types: Cigarettes  . Smokeless tobacco: Never Used  Substance Use Topics  . Alcohol use: No    Alcohol/week: 0.0 standard drinks  . Drug use: No     Review of Systems  Constitutional: No fever/chills Eyes: No visual changes. No discharge ENT: No upper respiratory complaints. Cardiovascular: no chest pain. Respiratory: no cough. No SOB. Gastrointestinal: No abdominal pain.  No nausea, no vomiting.  No diarrhea.  No constipation. Genitourinary: Negative for dysuria. No hematuria.  Positive for vaginal bleeding with  [redacted] weeks pregnant Musculoskeletal: Negative for musculoskeletal pain. Skin: Negative for rash, abrasions, lacerations, ecchymosis. Neurological: Negative for headaches, focal weakness or numbness. 10-point ROS otherwise negative.  ____________________________________________   PHYSICAL EXAM:  VITAL SIGNS: ED Triage Vitals  Enc Vitals Group     BP 12/07/19 1427 (!) 152/109     Pulse Rate 12/07/19 1427 (!) 117     Resp 12/07/19 1427 18     Temp 12/07/19 1427 (!) 97.5 F (36.4 C)     Temp src --      SpO2 12/07/19 1427 99 %     Weight 12/07/19 1429 186 lb (84.4 kg)     Height 12/07/19 1429 5\' 7"  (1.702 m)     Head Circumference --      Peak Flow --      Pain Score 12/07/19 1429 0     Pain Loc --      Pain Edu? --      Excl. in [place]? --      Constitutional: Alert and oriented. Well appearing and in no acute distress. Eyes: Conjunctivae are normal. PERRL. EOMI. Head: Atraumatic. ENT:      Ears:       Nose: No congestion/rhinnorhea.      Mouth/Throat: Mucous membranes are moist.  Neck: No stridor.    Cardiovascular: Normal rate, regular rhythm. Normal S1 and S2.  Good peripheral circulation. Respiratory: Normal respiratory effort without tachypnea or retractions. Lungs CTAB. Good air entry to the bases with no decreased or absent breath sounds. Gastrointestinal: Bowel sounds 4 quadrants. Soft and nontender to palpation. No guarding or rigidity. No palpable masses. No distention. No CVA tenderness. Musculoskeletal: Full range of motion to all extremities. No gross deformities appreciated. Neurologic:  Normal speech and language. No gross focal neurologic deficits are appreciated.  Skin:  Skin is warm, dry and intact. No rash noted. Psychiatric: Mood and affect are normal. Speech and behavior are normal. Patient exhibits appropriate insight and judgement.   ____________________________________________   LABS (all labs ordered are listed, but only abnormal results are  displayed)  Labs Reviewed  CBC - Abnormal; Notable for the following components:      Result Value   WBC 11.3 (*)    All other components within normal limits  COMPREHENSIVE METABOLIC PANEL - Abnormal; Notable for the following components:   Sodium 134 (*)    CO2 19 (*)    All other components within normal limits  HCG, QUANTITATIVE, PREGNANCY - Abnormal; Notable for the following components:   hCG, Beta Chain, Quant, S [id number] (*)    All other components within normal limits  POCT PREGNANCY, URINE - Abnormal; Notable for the following components:   Preg Test, Ur POSITIVE (*)    All other components within normal limits  POC URINE PREG, ED  ABO/RH   ____________________________________________  EKG   ____________________________________________  RADIOLOGY I personally viewed and evaluated these images  as part of my medical decision making, as well as reviewing the written report by the radiologist.  US OB LESS THAN 14 WEEKS WITH OB TRANSVAGINAL  Result Date: 12/07/2019 CLINICAL DATA:  Vaginal bleeding beginning today. EXAM: OBSTETRIC <14 WK [place] AND TRANSVAGINAL OB US TECHNIQUE: Both transabdominal and transvaginal ultrasound examinations were performed for complete evaluation of the gestation as well as the maternal uterus, adnexal regions, and pelvic cul-de-sac. Transvaginal technique was performed to assess early pregnancy. COMPARISON:  None. FINDINGS: Intrauterine gestational sac: Single Yolk sac:  Visualized. Embryo:  Visualized. Cardiac Activity: Visualized. Heart Rate: 169 bpm CRL:  37 mm   10 w   4 d                  [place] EDC: 06/30/2020 Subchorionic hemorrhage:  Small subchorionic hemorrhage is seen. Maternal uterus/adnexae: Simple left ovarian cyst noted measuring 4.5 by 2.6 x 3.2 cm. Small right ovarian follicular or corpus luteum cyst also noted. No suspicious adnexal mass or abnormal free fluid identified. IMPRESSION: Single living IUP with estimated gestational age of [redacted]  weeks 4 days, and [place] EDC of 06/30/2020. Small subchorionic hemorrhage noted. Electronically Signed   By: Danae Orleans M.D.   On: 12/07/2019 16:21    ____________________________________________    PROCEDURES  Procedure(s) performed:    Procedures    Medications - No data to display   ____________________________________________   INITIAL IMPRESSION / ASSESSMENT AND PLAN / ED COURSE  Pertinent labs & imaging results that were available during my care of the patient were reviewed by me and considered in my medical decision making (see chart for details).  Review of the [place] CSRS was performed in accordance of the [facility] prior to dispensing any controlled drugs.           Patient's diagnosis is consistent with vaginal bleeding during pregnancy, subchorionic hemorrhage in the first trimester.  Patient presented to emergency department with vaginal bleeding beginning today.  On assessment, exam is reassuring with no acute findings.  Patient with single intrauterine pregnancy at 10 weeks 4 days.  Good cardiac activity visualized.  Patient has a small subchorionic hemorrhage.  I discussed limiting activity, following up with OB/GYN.  No medications at this time.  Patient has no other complaints and no further work-up deemed necessary.  Labs, vitals are reassuring.  Follow-up with OB/GYN. Patient is given ED precautions to return to the ED for any worsening or new symptoms.     ____________________________________________  FINAL CLINICAL IMPRESSION(S) / ED DIAGNOSES  Final diagnoses:  Vaginal bleeding  Vaginal bleeding in pregnancy  Subchorionic hematoma in first trimester, single or unspecified fetus      NEW MEDICATIONS STARTED DURING THIS VISIT:  ED Discharge Orders    None          This chart was dictated using voice recognition software/Dragon. Despite best efforts to proofread, errors can occur which can change the meaning. Any change was purely  unintentional.    Racheal Patches, PA-C 12/07/19 1710    Sharyn Creamer, MD 12/08/19 [phone]

## 2019-12-07 NOTE — ED Triage Notes (Signed)
FIRST NURSE NOTE- [redacted] weeks pregnant. Started bleeding at work.  No pads used yet. ambulatory

## 2019-12-07 NOTE — ED Triage Notes (Signed)
Pt to ER states she is [redacted] weeks pregnant and began bleeding approximately 10 - 15 minutes prior to arrival.  Pt denies abdominal pain or cramping.  Pt is G2 P1 one other child.

## 2019-12-07 NOTE — ED Notes (Signed)
Pt states she is [redacted] weeks pregnant and this is her second pregnancy.

## 2019-12-11 LAB — OB RESULTS CONSOLE VARICELLA ZOSTER ANTIBODY, IGG: Varicella: IMMUNE

## 2019-12-11 LAB — OB RESULTS CONSOLE RUBELLA ANTIBODY, IGM: Rubella: IMMUNE

## 2019-12-11 LAB — OB RESULTS CONSOLE RPR: RPR: NONREACTIVE

## 2019-12-11 LAB — OB RESULTS CONSOLE HEPATITIS B SURFACE ANTIGEN: Hepatitis B Surface Ag: NEGATIVE

## 2019-12-11 LAB — OB RESULTS CONSOLE HIV ANTIBODY (ROUTINE TESTING): HIV: NONREACTIVE

## 2020-01-08 LAB — OB RESULTS CONSOLE GC/CHLAMYDIA
Chlamydia: NEGATIVE
Gonorrhea: NEGATIVE

## 2020-05-05 ENCOUNTER — Observation Stay
Admission: EM | Admit: 2020-05-05 | Discharge: 2020-05-05 | Disposition: A | Discharge status: Home / Self Care | Payer: 59

## 2020-05-05 ENCOUNTER — Encounter: Payer: Self-pay | Admitting: Obstetrics and Gynecology

## 2020-05-05 ENCOUNTER — Other Ambulatory Visit: Payer: Self-pay

## 2020-05-05 DIAGNOSIS — O10913 Unspecified pre-existing hypertension complicating pregnancy, third trimester: Secondary | ICD-10-CM | POA: Insufficient documentation

## 2020-05-05 DIAGNOSIS — Z818 Family history of other mental and behavioral disorders: Secondary | ICD-10-CM | POA: Insufficient documentation

## 2020-05-05 DIAGNOSIS — Z3A31 31 weeks gestation of pregnancy: Secondary | ICD-10-CM | POA: Diagnosis not present

## 2020-05-05 DIAGNOSIS — Z833 Family history of diabetes mellitus: Secondary | ICD-10-CM | POA: Insufficient documentation

## 2020-05-05 DIAGNOSIS — O26899 Other specified pregnancy related conditions, unspecified trimester: Secondary | ICD-10-CM | POA: Diagnosis present

## 2020-05-05 DIAGNOSIS — Z79899 Other long term (current) drug therapy: Secondary | ICD-10-CM | POA: Insufficient documentation

## 2020-05-05 DIAGNOSIS — J45909 Unspecified asthma, uncomplicated: Secondary | ICD-10-CM | POA: Diagnosis not present

## 2020-05-05 DIAGNOSIS — Z811 Family history of alcohol abuse and dependence: Secondary | ICD-10-CM | POA: Insufficient documentation

## 2020-05-05 DIAGNOSIS — Z87891 Personal history of nicotine dependence: Secondary | ICD-10-CM | POA: Insufficient documentation

## 2020-05-05 DIAGNOSIS — Z8349 Family history of other endocrine, nutritional and metabolic diseases: Secondary | ICD-10-CM | POA: Diagnosis not present

## 2020-05-05 DIAGNOSIS — Z7952 Long term (current) use of systemic steroids: Secondary | ICD-10-CM | POA: Insufficient documentation

## 2020-05-05 DIAGNOSIS — Z809 Family history of malignant neoplasm, unspecified: Secondary | ICD-10-CM | POA: Diagnosis not present

## 2020-05-05 DIAGNOSIS — O26893 Other specified pregnancy related conditions, third trimester: Principal | ICD-10-CM | POA: Insufficient documentation

## 2020-05-05 DIAGNOSIS — Z8249 Family history of ischemic heart disease and other diseases of the circulatory system: Secondary | ICD-10-CM | POA: Insufficient documentation

## 2020-05-05 DIAGNOSIS — O99513 Diseases of the respiratory system complicating pregnancy, third trimester: Secondary | ICD-10-CM | POA: Diagnosis not present

## 2020-05-05 DIAGNOSIS — Z8379 Family history of other diseases of the digestive system: Secondary | ICD-10-CM | POA: Insufficient documentation

## 2020-05-05 DIAGNOSIS — Z8489 Family history of other specified conditions: Secondary | ICD-10-CM | POA: Insufficient documentation

## 2020-05-05 DIAGNOSIS — Z9049 Acquired absence of other specified parts of digestive tract: Secondary | ICD-10-CM | POA: Diagnosis not present

## 2020-05-05 DIAGNOSIS — R109 Unspecified abdominal pain: Secondary | ICD-10-CM | POA: Diagnosis present

## 2020-05-05 LAB — URINALYSIS, COMPLETE (UACMP) WITH MICROSCOPIC
Bacteria, UA: NONE SEEN
Bilirubin Urine: NEGATIVE
Glucose, UA: 150 mg/dL — AB
Hgb urine dipstick: NEGATIVE
Ketones, ur: 5 mg/dL — AB
Leukocytes,Ua: NEGATIVE
Nitrite: NEGATIVE
Protein, ur: NEGATIVE mg/dL
Specific Gravity, Urine: 1.013 (ref 1.005–1.030)
pH: 6 (ref 5.0–8.0)

## 2020-05-05 MED ORDER — CALCIUM CARBONATE ANTACID 500 MG PO CHEW: 2.0000 | CHEWABLE_TABLET | ORAL | Status: DC | PRN

## 2020-05-05 MED ORDER — ACETAMINOPHEN 500 MG PO TABS
1000.0000 mg | ORAL_TABLET | Freq: Four times a day (QID) | ORAL | Status: DC | PRN
  Administered 2020-05-05: 1000 mg via ORAL

## 2020-05-05 MED ORDER — ACETAMINOPHEN 500 MG PO TABS
ORAL_TABLET | ORAL | Status: AC
  Filled 2020-05-05: qty 2

## 2020-05-05 NOTE — Discharge Summary (Signed)
Patricia Duarte is a 29 y.o. female. She is at [redacted]w[redacted]d gestation. Patient's last menstrual period was 09/28/2019. Estimated Date of Delivery: 07/04/20  Prenatal care site: [facility] Chief complaint: abdominal pain   [place] presents today for abdominal pain that started last night and has continued to this morning.  She worked 10 hours without a break yesterday at work and noticed she felt worse after the shift.  Has not tried any pain medication.  S: Resting comfortably. no CTX, no VB.no LOF,  Active fetal movement.   Maternal Medical History:  Past Medical Hx:  has a past medical history of Asthma and Hypertension.    Past Surgical Hx:  has a past surgical history that includes Cholecystectomy (2008).   No Known Allergies   Prior to Admission medications   Medication Sig Start Date End Date Taking? Authorizing Provider  albuterol (PROAIR HFA) 108 (90 BASE) MCG/ACT inhaler Inhale into the lungs.    [provider]  amoxicillin (AMOXIL) 500 MG capsule Take 1 capsule (500 mg total) by mouth 3 (three) times daily. 08/03/18   Joni Reining, PA-C  fexofenadine-pseudoephedrine (ALLEGRA-D) 60-120 MG 12 hr tablet Take 1 tablet by mouth 2 (two) times daily. 08/03/18   Joni Reining, PA-C  labetalol (NORMODYNE) 100 MG tablet Take 1 tablet (100 mg total) by mouth 2 (two) times daily. 03/17/17   Ellyn Hack, MD  labetalol (NORMODYNE) 100 MG tablet Take 1 tablet (100 mg total) by mouth 2 (two) times daily. 08/03/18   Joni Reining, PA-C  lidocaine (XYLOCAINE) 2 % solution Use as directed 5 mLs in the mouth or throat every 6 (six) hours as needed for mouth pain. 08/03/18   Joni Reining, PA-C  predniSONE (DELTASONE) 10 MG tablet Take 6 tablets  today, on day 2 take 5 tablets, day 3 take 4 tablets, day 4 take 3 tablets, day 5 take  2 tablets and 1 tablet the last day 08/23/17   Tommi Rumps, PA-C    Social History: She  reports that she has quit smoking. Her smoking  use included cigarettes. She smoked 0.50 packs per day. She has never used smokeless tobacco. She reports that she does not drink alcohol and does not use drugs.  Family History: family history includes Alcohol abuse in her father; Anxiety disorder in her mother; Cancer in her maternal grandfather; Cirrhosis in her father; Diabetes in her maternal grandfather and maternal grandmother; Drug abuse in her father; Heart attack in her paternal grandmother; Hyperlipidemia in her maternal grandmother; Hypertension in her brother, maternal grandfather, maternal grandmother, and mother.   Review of Systems: A full review of systems was performed and negative except as noted in the HPI.    O:  BP 118/81   Pulse 104   Temp 98.9 F (37.2 C) (Oral)   Resp 12   LMP 09/28/2019  Results for orders placed or performed during the hospital encounter of 05/05/20 (from the past 48 hour(s))  Urinalysis, Complete w Microscopic   Collection Time: 05/05/20 11:13 AM  Result Value Ref Range   Color, Urine YELLOW (A) YELLOW   APPearance CLEAR (A) CLEAR   Specific Gravity, Urine 1.013 1.005 - 1.030   pH 6.0 5.0 - 8.0   Glucose, UA 150 (A) NEGATIVE mg/dL   Hgb urine dipstick NEGATIVE NEGATIVE   Bilirubin Urine NEGATIVE NEGATIVE   Ketones, ur 5 (A) NEGATIVE mg/dL   Protein, ur NEGATIVE NEGATIVE mg/dL   Nitrite  NEGATIVE NEGATIVE   Leukocytes,Ua NEGATIVE NEGATIVE   WBC, UA 0-5 0 - 5 WBC/hpf   Bacteria, UA NONE SEEN NONE SEEN   Squamous Epithelial / LPF 0-5 0 - 5   Mucus PRESENT      Constitutional: NAD, AAOx3  HE/ENT: extraocular movements grossly intact, moist mucous membranes CV: RRR PULM: nl respiratory effort, CTABL     Abd: gravid, non-tender, non-distended, soft      Ext: Non-tender, Nonedmeatous   Psych: mood appropriate, speech normal Pelvic deferred  NST:  Baseline: 150bpm  Variability: moderate Accelerations present x >2 Decelerations absent Time 40 mins  Assessment: 29 y.o. [redacted]w[redacted]d here for  antenatal surveillance during pregnancy.  Principle diagnosis: Abdominal pain in pregnancy   Plan:  Labor: not present.   Fetal Wellbeing: Reassuring Cat 1 tracing.  Reactive NST  Abdominal pain improved with Tylenol    Precautions and warning signs reviewed   D/c home stable, precautions reviewed, follow-up as scheduled.   ----- Margaretmary Eddy, CNM Certified Nurse Midwife [facility]

## 2020-05-05 NOTE — OB Triage Note (Signed)
Pt states " she worked 10 hrs yesterday with no break and on her feet a whole lot. Last night she was having abdominal pain that has continued into this morning." Denies any vaginal bleeding or leaking fluid. Confirms baby movement.

## 2020-05-06 LAB — URINE CULTURE: Culture: NO GROWTH

## 2020-05-23 ENCOUNTER — Encounter: Payer: Self-pay | Admitting: Obstetrics and Gynecology

## 2020-05-23 ENCOUNTER — Observation Stay
Admission: EM | Admit: 2020-05-23 | Discharge: 2020-05-23 | Disposition: A | Discharge status: Home / Self Care | Payer: 59 | Attending: Obstetrics and Gynecology | Admitting: Obstetrics and Gynecology

## 2020-05-23 ENCOUNTER — Other Ambulatory Visit: Payer: Self-pay

## 2020-05-23 DIAGNOSIS — Z3A34 34 weeks gestation of pregnancy: Secondary | ICD-10-CM | POA: Diagnosis not present

## 2020-05-23 DIAGNOSIS — Z0371 Encounter for suspected problem with amniotic cavity and membrane ruled out: Principal | ICD-10-CM | POA: Insufficient documentation

## 2020-05-23 DIAGNOSIS — O99513 Diseases of the respiratory system complicating pregnancy, third trimester: Secondary | ICD-10-CM | POA: Diagnosis not present

## 2020-05-23 DIAGNOSIS — O4290 Premature rupture of membranes, unspecified as to length of time between rupture and onset of labor, unspecified weeks of gestation: Secondary | ICD-10-CM | POA: Diagnosis present

## 2020-05-23 DIAGNOSIS — O163 Unspecified maternal hypertension, third trimester: Secondary | ICD-10-CM | POA: Diagnosis not present

## 2020-05-23 DIAGNOSIS — J45909 Unspecified asthma, uncomplicated: Secondary | ICD-10-CM | POA: Insufficient documentation

## 2020-05-23 LAB — RUPTURE OF MEMBRANE (ROM)PLUS: Rom Plus: NEGATIVE

## 2020-05-23 MED ORDER — ACETAMINOPHEN 325 MG PO TABS: 650.0000 mg | ORAL_TABLET | ORAL | Status: DC | PRN

## 2020-05-23 MED ORDER — CALCIUM CARBONATE ANTACID 500 MG PO CHEW: 2.0000 | CHEWABLE_TABLET | ORAL | Status: DC | PRN

## 2020-05-23 NOTE — Discharge Summary (Signed)
Patricia Duarte is a 29 y.o. female. She is at [redacted]w[redacted]d gestation. Patient's last menstrual period was 09/28/2019. Estimated Date of Delivery: 07/04/20  Prenatal care site: [facility]   Chief complaint: reports gush of fluid today while at work   States she had a gush of fluid at work today with no continued leaking afterwards.  Denies contractions or vaginal bleeding.  Reports intermittent sharp vaginal pains.   S: Resting comfortably. no CTX, no VB,  Active fetal movement.   Maternal Medical History:  Past Medical Hx:  has a past medical history of Asthma and Hypertension.    Past Surgical Hx:  has a past surgical history that includes Cholecystectomy (2008).   No Known Allergies   Prior to Admission medications   Medication Sig Start Date End Date Taking? Authorizing Provider  albuterol (PROAIR HFA) 108 (90 BASE) MCG/ACT inhaler Inhale into the lungs.    [provider]  amoxicillin (AMOXIL) 500 MG capsule Take 1 capsule (500 mg total) by mouth 3 (three) times daily. 08/03/18   Joni Reining, PA-C  fexofenadine-pseudoephedrine (ALLEGRA-D) 60-120 MG 12 hr tablet Take 1 tablet by mouth 2 (two) times daily. 08/03/18   Joni Reining, PA-C  labetalol (NORMODYNE) 100 MG tablet Take 1 tablet (100 mg total) by mouth 2 (two) times daily. 03/17/17   Ellyn Hack, MD  labetalol (NORMODYNE) 100 MG tablet Take 1 tablet (100 mg total) by mouth 2 (two) times daily. 08/03/18   Joni Reining, PA-C  lidocaine (XYLOCAINE) 2 % solution Use as directed 5 mLs in the mouth or throat every 6 (six) hours as needed for mouth pain. 08/03/18   Joni Reining, PA-C  predniSONE (DELTASONE) 10 MG tablet Take 6 tablets  today, on day 2 take 5 tablets, day 3 take 4 tablets, day 4 take 3 tablets, day 5 take  2 tablets and 1 tablet the last day 08/23/17   Tommi Rumps, PA-C    Social History: She  reports that she has quit smoking. Her smoking use included cigarettes. She smoked 0.50  packs per day. She has never used smokeless tobacco. She reports that she does not drink alcohol and does not use drugs.  Family History: family history includes Alcohol abuse in her father; Anxiety disorder in her mother; Cancer in her maternal grandfather; Cirrhosis in her father; Diabetes in her maternal grandfather and maternal grandmother; Drug abuse in her father; Heart attack in her paternal grandmother; Hyperlipidemia in her maternal grandmother; Hypertension in her brother, maternal grandfather, maternal grandmother, and mother.   Review of Systems: A full review of systems was performed and negative except as noted in the HPI.    O:  BP 128/87 (BP Location: Left Arm)   Pulse (!) 106   Temp 98.3 F (36.8 C) (Oral)   Resp 14   LMP 09/28/2019  Results for orders placed or performed during the hospital encounter of 05/23/20 (from the past 48 hour(s))  ROM Plus ([facility] only)   Collection Time: 05/23/20 12:57 PM  Result Value Ref Range   Rom Plus NEGATIVE      Constitutional: NAD, AAOx3  HE/ENT: extraocular movements grossly intact, moist mucous membranes CV: RRR PULM: nl respiratory effort, CTABL     Abd: gravid, non-tender, non-distended, soft      Ext: Non-tender, Nonedmeatous   Psych: mood appropriate, speech normal Pelvic deferred  NST:  Baseline: 130 bpm  Variability: moderate Accelerations present x >2 Decelerations absent  Time   Assessment: 29 y.o. [redacted]w[redacted]d here for antenatal surveillance during pregnancy.  Principle diagnosis: reassuring fetal status, intact membranes   Plan:  Labor: not present.   Fetal Wellbeing: Reassuring Cat 1 tracing.  Reactive NST   ROM + negative, no continued leaking noted   Reviewed precautions, warning signs   D/c home stable, precautions reviewed, follow-up as scheduled.   ----- Margaretmary Eddy, CNM Certified Nurse Midwife [facility]

## 2020-05-23 NOTE — OB Triage Note (Signed)
Pt c/o leaking fluid around 11:55am today.

## 2020-06-04 ENCOUNTER — Other Ambulatory Visit: Payer: Self-pay | Admitting: Certified Nurse Midwife

## 2020-06-04 NOTE — Progress Notes (Signed)
  Patricia Duarte is a 29 y.o. G24P1001 female dated by LMP c/w [redacted]w[redacted]d ultrasound.    Pregnancy Issues: 1. Chronic hypertension, taking labetalol 200mg  PO BID 2. Prepregnancy BMI 29.6 3. Trich in early pregnancy, treated 4. 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 S>D at 30 weeks, growth u/s 9/1 EFW: 6lb7oz 2921g 55%, AFI: 12.70cm  Prenatal care site: [facility]    Prenatal Labs: Blood type/Rh O+  Antibody screen neg  Rubella Immune  Varicella Immune  RPR NR  HBsAg Neg  HIV NR  GC neg  Chlamydia neg  Genetic screening cfDNA negative, XX  1 hour GTT 129  3 hour GTT n/a  GBS pending    Post Partum Planning: - Infant feeding: breast - Contraception: BTL, consents signed  Tdap: 04/15/2020 Flu: declined

## 2020-06-06 LAB — OB RESULTS CONSOLE HIV ANTIBODY (ROUTINE TESTING): HIV: NONREACTIVE

## 2020-06-06 LAB — OB RESULTS CONSOLE RPR: RPR: NONREACTIVE

## 2020-06-06 LAB — OB RESULTS CONSOLE GBS: GBS: NEGATIVE

## 2020-06-16 ENCOUNTER — Other Ambulatory Visit
Admission: RE | Admit: 2020-06-16 | Discharge: 2020-06-16 | Disposition: A | Discharge status: Home / Self Care | Payer: 59 | Source: Ambulatory Visit

## 2020-06-16 ENCOUNTER — Other Ambulatory Visit: Payer: Self-pay

## 2020-06-16 DIAGNOSIS — Z01812 Encounter for preprocedural laboratory examination: Secondary | ICD-10-CM | POA: Insufficient documentation

## 2020-06-16 DIAGNOSIS — Z20822 Contact with and (suspected) exposure to covid-19: Secondary | ICD-10-CM | POA: Insufficient documentation

## 2020-06-17 ENCOUNTER — Encounter: Payer: Self-pay | Admitting: Obstetrics and Gynecology

## 2020-06-17 ENCOUNTER — Other Ambulatory Visit: Payer: Self-pay

## 2020-06-17 ENCOUNTER — Inpatient Hospital Stay: Payer: 59 | Admitting: Anesthesiology

## 2020-06-17 ENCOUNTER — Inpatient Hospital Stay
Admission: EM | Admit: 2020-06-17 | Discharge: 2020-06-19 | DRG: 798 | Disposition: A | Discharge status: Home / Self Care | Payer: 59

## 2020-06-17 DIAGNOSIS — Z20822 Contact with and (suspected) exposure to covid-19: Secondary | ICD-10-CM | POA: Diagnosis present

## 2020-06-17 DIAGNOSIS — O9952 Diseases of the respiratory system complicating childbirth: Secondary | ICD-10-CM | POA: Diagnosis present

## 2020-06-17 DIAGNOSIS — O99214 Obesity complicating childbirth: Secondary | ICD-10-CM | POA: Diagnosis present

## 2020-06-17 DIAGNOSIS — Z87891 Personal history of nicotine dependence: Secondary | ICD-10-CM

## 2020-06-17 DIAGNOSIS — Z302 Encounter for sterilization: Secondary | ICD-10-CM

## 2020-06-17 DIAGNOSIS — J45909 Unspecified asthma, uncomplicated: Secondary | ICD-10-CM | POA: Diagnosis present

## 2020-06-17 DIAGNOSIS — Z3A37 37 weeks gestation of pregnancy: Secondary | ICD-10-CM | POA: Diagnosis not present

## 2020-06-17 DIAGNOSIS — E669 Obesity, unspecified: Secondary | ICD-10-CM | POA: Diagnosis present

## 2020-06-17 DIAGNOSIS — O1002 Pre-existing essential hypertension complicating childbirth: Secondary | ICD-10-CM | POA: Diagnosis present

## 2020-06-17 DIAGNOSIS — O10919 Unspecified pre-existing hypertension complicating pregnancy, unspecified trimester: Secondary | ICD-10-CM | POA: Diagnosis present

## 2020-06-17 LAB — PROTEIN / CREATININE RATIO, URINE
Creatinine, Urine: 188 mg/dL
Protein Creatinine Ratio: 0.1 mg/mg{Cre} (ref 0.00–0.15)
Total Protein, Urine: 19 mg/dL

## 2020-06-17 LAB — CBC
HCT: 35.7 % — ABNORMAL LOW (ref 36.0–46.0)
Hemoglobin: 12.3 g/dL (ref 12.0–15.0)
MCH: 31.3 pg (ref 26.0–34.0)
MCHC: 34.5 g/dL (ref 30.0–36.0)
MCV: 90.8 fL (ref 80.0–100.0)
Platelets: 184 10*3/uL (ref 150–400)
RBC: 3.93 MIL/uL (ref 3.87–5.11)
RDW: 14.3 % (ref 11.5–15.5)
WBC: 8.7 10*3/uL (ref 4.0–10.5)
nRBC: 0 % (ref 0.0–0.2)

## 2020-06-17 LAB — SARS CORONAVIRUS 2 (TAT 6-24 HRS): SARS Coronavirus 2: NEGATIVE

## 2020-06-17 LAB — COMPREHENSIVE METABOLIC PANEL
ALT: 13 U/L (ref 0–44)
AST: 21 U/L (ref 15–41)
Albumin: 3.1 g/dL — ABNORMAL LOW (ref 3.5–5.0)
Alkaline Phosphatase: 105 U/L (ref 38–126)
Anion gap: 10 (ref 5–15)
BUN: 6 mg/dL (ref 6–20)
CO2: 18 mmol/L — ABNORMAL LOW (ref 22–32)
Calcium: 8.8 mg/dL — ABNORMAL LOW (ref 8.9–10.3)
Chloride: 107 mmol/L (ref 98–111)
Creatinine, Ser: 0.52 mg/dL (ref 0.44–1.00)
GFR calc Af Amer: >60 mL/min (ref >60)
GFR calc non Af Amer: >60 mL/min (ref >60)
Glucose, Bld: 113 mg/dL — ABNORMAL HIGH (ref 70–99)
Potassium: 3.5 mmol/L (ref 3.5–5.1)
Sodium: 135 mmol/L (ref 135–145)
Total Bilirubin: 0.6 mg/dL (ref 0.3–1.2)
Total Protein: 6.7 g/dL (ref 6.5–8.1)

## 2020-06-17 LAB — TYPE AND SCREEN
ABO/RH(D): O POS
Antibody Screen: NEGATIVE

## 2020-06-17 MED ORDER — SOD CITRATE-CITRIC ACID 500-334 MG/5ML PO SOLN: 30.0000 mL | ORAL | Status: DC | PRN

## 2020-06-17 MED ORDER — LACTATED RINGERS IV SOLN
500.0000 mL | Freq: Once | INTRAVENOUS | Status: AC
  Administered 2020-06-17: 500 mL via INTRAVENOUS

## 2020-06-17 MED ORDER — LIDOCAINE-EPINEPHRINE (PF) 1.5 %-1:200000 IJ SOLN
INTRAMUSCULAR | Status: DC | PRN
  Administered 2020-06-17: 2 mL via PERINEURAL
  Administered 2020-06-17: 3 mL via PERINEURAL

## 2020-06-17 MED ORDER — BUTORPHANOL TARTRATE 1 MG/ML IJ SOLN: 1.0000 mg | INTRAMUSCULAR | Status: DC | PRN

## 2020-06-17 MED ORDER — ACETAMINOPHEN 325 MG PO TABS: 650.0000 mg | ORAL_TABLET | ORAL | Status: DC | PRN

## 2020-06-17 MED ORDER — PHENYLEPHRINE 40 MCG/ML (10ML) SYRINGE FOR IV PUSH (FOR BLOOD PRESSURE SUPPORT)
80.0000 ug | PREFILLED_SYRINGE | INTRAVENOUS | Status: DC | PRN
  Filled 2020-06-17: qty 10

## 2020-06-17 MED ORDER — OXYTOCIN-SODIUM CHLORIDE 30-0.9 UT/500ML-% IV SOLN: 2.5000 [IU]/h | INTRAVENOUS | Status: DC

## 2020-06-17 MED ORDER — LABETALOL HCL 5 MG/ML IV SOLN: 20.0000 mg | INTRAVENOUS | Status: DC | PRN

## 2020-06-17 MED ORDER — FENTANYL 2.5 MCG/ML W/ROPIVACAINE 0.15% IN NS 100 ML EPIDURAL (ARMC)
12.0000 mL/h | EPIDURAL | Status: DC
  Administered 2020-06-17: 12 mL/h via EPIDURAL

## 2020-06-17 MED ORDER — MISOPROSTOL 100 MCG PO TABS
25.0000 ug | ORAL_TABLET | ORAL | Status: DC | PRN
  Administered 2020-06-17: 25 ug via VAGINAL
  Filled 2020-06-17 (×2): qty 1

## 2020-06-17 MED ORDER — EPHEDRINE 5 MG/ML INJ
10.0000 mg | INTRAVENOUS | Status: DC | PRN
  Administered 2020-06-17: 5 mg via INTRAVENOUS
  Filled 2020-06-17: qty 4
  Filled 2020-06-17: qty 2

## 2020-06-17 MED ORDER — HYDRALAZINE HCL 20 MG/ML IJ SOLN: 10.0000 mg | INTRAMUSCULAR | Status: DC | PRN

## 2020-06-17 MED ORDER — LIDOCAINE HCL (PF) 1 % IJ SOLN
INTRAMUSCULAR | Status: DC | PRN
  Administered 2020-06-17: 3 mL

## 2020-06-17 MED ORDER — LABETALOL HCL 5 MG/ML IV SOLN: 40.0000 mg | INTRAVENOUS | Status: DC | PRN

## 2020-06-17 MED ORDER — AMMONIA AROMATIC IN INHA
RESPIRATORY_TRACT | Status: AC
  Filled 2020-06-17: qty 10

## 2020-06-17 MED ORDER — LIDOCAINE HCL (PF) 1 % IJ SOLN
INTRAMUSCULAR | Status: AC
  Filled 2020-06-17: qty 30

## 2020-06-17 MED ORDER — LACTATED RINGERS IV SOLN: 500.0000 mL | INTRAVENOUS | Status: DC | PRN

## 2020-06-17 MED ORDER — LIDOCAINE HCL (PF) 1 % IJ SOLN: 30.0000 mL | INTRAMUSCULAR | Status: DC | PRN

## 2020-06-17 MED ORDER — DIPHENHYDRAMINE HCL 50 MG/ML IJ SOLN: 12.5000 mg | INTRAMUSCULAR | Status: DC | PRN

## 2020-06-17 MED ORDER — BUPIVACAINE HCL (PF) 0.25 % IJ SOLN
INTRAMUSCULAR | Status: DC | PRN
  Administered 2020-06-17: 2 mL via EPIDURAL
  Administered 2020-06-17: 4 mL via EPIDURAL

## 2020-06-17 MED ORDER — OXYTOCIN-SODIUM CHLORIDE 30-0.9 UT/500ML-% IV SOLN
1.0000 m[IU]/min | INTRAVENOUS | Status: DC
  Administered 2020-06-17: 2 m[IU]/min via INTRAVENOUS
  Filled 2020-06-17: qty 500

## 2020-06-17 MED ORDER — LABETALOL HCL 200 MG PO TABS
200.0000 mg | ORAL_TABLET | Freq: Two times a day (BID) | ORAL | Status: DC
  Administered 2020-06-17 – 2020-06-19 (×4): 200 mg via ORAL
  Filled 2020-06-17: qty 1
  Filled 2020-06-17: qty 2
  Filled 2020-06-17 (×2): qty 1

## 2020-06-17 MED ORDER — ONDANSETRON HCL 4 MG/2ML IJ SOLN
4.0000 mg | Freq: Four times a day (QID) | INTRAMUSCULAR | Status: DC | PRN
  Administered 2020-06-17: 4 mg via INTRAVENOUS
  Filled 2020-06-17: qty 2

## 2020-06-17 MED ORDER — LACTATED RINGERS IV SOLN: INTRAVENOUS | Status: DC

## 2020-06-17 MED ORDER — LABETALOL HCL 5 MG/ML IV SOLN: 80.0000 mg | INTRAVENOUS | Status: DC | PRN

## 2020-06-17 MED ORDER — OXYTOCIN BOLUS FROM INFUSION
333.0000 mL | Freq: Once | INTRAVENOUS | Status: AC
  Administered 2020-06-17: 333 mL via INTRAVENOUS

## 2020-06-17 MED ORDER — EPHEDRINE 5 MG/ML INJ
10.0000 mg | INTRAVENOUS | Status: DC | PRN
  Filled 2020-06-17: qty 2

## 2020-06-17 MED ORDER — FENTANYL 2.5 MCG/ML W/ROPIVACAINE 0.15% IN NS 100 ML EPIDURAL (ARMC)
EPIDURAL | Status: AC
  Filled 2020-06-17: qty 100

## 2020-06-17 MED ORDER — TERBUTALINE SULFATE 1 MG/ML IJ SOLN: 0.2500 mg | Freq: Once | INTRAMUSCULAR | Status: DC | PRN

## 2020-06-17 MED ORDER — OXYTOCIN 10 UNIT/ML IJ SOLN
INTRAMUSCULAR | Status: AC
  Filled 2020-06-17: qty 2

## 2020-06-17 MED ORDER — MISOPROSTOL 100 MCG PO TABS
25.0000 ug | ORAL_TABLET | ORAL | Status: DC | PRN
  Administered 2020-06-17: 25 ug via BUCCAL
  Filled 2020-06-17 (×2): qty 1

## 2020-06-17 MED ORDER — MISOPROSTOL 200 MCG PO TABS
ORAL_TABLET | ORAL | Status: AC
  Filled 2020-06-17: qty 4

## 2020-06-17 NOTE — H&P (Signed)
OB History & Physical   History of Present Illness:  Chief Complaint: IOL for CHTN  HPI:  Patricia Duarte is a 29 y.o. G58P1001 female at [redacted]w[redacted]d dated by LMP and c/w [redacted]w[redacted]d [place].  She presents to L&D for IOL due to [facility] on medications. Reports active FM, having occasional UCs, denies LOF or VB.  Denies HA, VD or RUQ pain.    Pregnancy Issues: 1. Chronic hypertension, taking labetalol 200mg  PO BID 2. Prepregnancy BMI 29.6 3. Trich in early pregnancy, treated 4. 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 S>D at 30 weeks, growth u/s 9/1 EFW:6lb7oz 2921g 55%, AFI:12.70cm   Maternal Medical History:   Past Medical History:  Diagnosis Date   Asthma    Hypertension    Chronic    Past Surgical History:  Procedure Laterality Date   CHOLECYSTECTOMY  2008    No Known Allergies  Prior to Admission medications   Medication Sig Start Date End Date Taking? Authorizing Provider  aspirin 81 MG chewable tablet Chew 81 mg by mouth daily.   Yes [provider]  labetalol (NORMODYNE) 100 MG tablet Take 1 tablet (100 mg total) by mouth 2 (two) times daily. 03/17/17  Yes 03/19/17, MD  labetalol (NORMODYNE) 100 MG tablet Take 1 tablet (100 mg total) by mouth 2 (two) times daily. 08/03/18  Yes 08/05/18, PA-C  Prenatal Vit-Fe Fumarate-FA (PRENATAL MULTIVITAMIN) TABS tablet Take 1 tablet by mouth daily at 12 noon.   Yes [provider]  albuterol (PROAIR HFA) 108 (90 BASE) MCG/ACT inhaler Inhale into the lungs. Patient not taking: Reported on 06/17/2020    [provider]  amoxicillin (AMOXIL) 500 MG capsule Take 1 capsule (500 mg total) by mouth 3 (three) times daily. Patient not taking: Reported on 06/17/2020 08/03/18   08/05/18, PA-C  fexofenadine-pseudoephedrine (ALLEGRA-D) 60-120 MG 12 hr tablet Take 1 tablet by mouth 2 (two) times daily. Patient not taking: Reported on 06/17/2020 08/03/18   08/05/18, PA-C  lidocaine  (XYLOCAINE) 2 % solution Use as directed 5 mLs in the mouth or throat every 6 (six) hours as needed for mouth pain. Patient not taking: Reported on 06/17/2020 08/03/18   08/05/18, PA-C  predniSONE (DELTASONE) 10 MG tablet Take 6 tablets  today, on day 2 take 5 tablets, day 3 take 4 tablets, day 4 take 3 tablets, day 5 take  2 tablets and 1 tablet the last day Patient not taking: Reported on 06/17/2020 08/23/17   08/25/17, PA-C     Prenatal care site: [facility]  Social History: She  reports that she quit smoking about 8 months ago. Her smoking use included cigarettes. She smoked 0.50 packs per day. She has never used smokeless tobacco. She reports that she does not drink alcohol and does not use drugs.  Family History: family history includes Alcohol abuse in her father; Anxiety disorder in her mother; Cancer in her maternal grandfather; Cirrhosis in her father; Diabetes in her maternal grandfather and maternal grandmother; Drug abuse in her father; Heart attack in her paternal grandmother; Hyperlipidemia in her maternal grandmother; Hypertension in her brother, maternal grandfather, maternal grandmother, and mother.   Review of Systems: A full review of systems was performed and negative except as noted in the HPI.     Physical Exam:  Vital Signs: BP (!) 141/84 (BP Location: Left Arm)    Pulse 95    Temp 97.6 F (  36.4 C) (Oral)    Resp 16    Ht 5\' 7"  (1.702 m)    Wt 96.6 kg    LMP 09/28/2019    BMI 33.36 kg/m  General: no acute distress.  HEENT: normocephalic, atraumatic Heart: regular rate & rhythm.  No murmurs/rubs/gallops Lungs: clear to auscultation bilaterally, no7lbs Pelvic:   External: Normal external female genitalia  Cervix: Dilation: 2 / Effacement (%): Thick / Station: -2    Extremities: non-tender, symmetric, trace LE edema bilaterally.  DTRs: 2+ Neurologic: Alert & oriented x 3.    Results for orders placed or performed during the hospital  encounter of 06/17/20 (from the past 24 hour(s))  CBC     Status: Abnormal   Collection Time: 06/17/20  8:15 AM  Result Value Ref Range   WBC 8.7 4.0 - 10.5 K/uL   RBC 3.93 3.87 - 5.11 MIL/uL   Hemoglobin 12.3 12.0 - 15.0 g/dL   HCT 06/19/20 (L) 36 - 46 %   MCV 90.8 80.0 - 100.0 fL   MCH 31.3 26.0 - 34.0 pg   MCHC 34.5 30.0 - 36.0 g/dL   RDW 46.9 62.9 - 52.8 %   Platelets 184 150 - 400 K/uL   nRBC 0.0 0.0 - 0.2 %  Comprehensive metabolic panel     Status: Abnormal   Collection Time: 06/17/20  8:15 AM  Result Value Ref Range   Sodium 135 135 - 145 mmol/L   Potassium 3.5 3.5 - 5.1 mmol/L   Chloride 107 98 - 111 mmol/L   CO2 18 (L) 22 - 32 mmol/L   Glucose, Bld 113 (H) 70 - 99 mg/dL   BUN 6 6 - 20 mg/dL   Creatinine, Ser 06/19/20 0.44 - 1.00 mg/dL   Calcium 8.8 (L) 8.9 - 10.3 mg/dL   Total Protein 6.7 6.5 - 8.1 g/dL   Albumin 3.1 (L) 3.5 - 5.0 g/dL   AST 21 15 - 41 U/L   ALT 13 0 - 44 U/L   Alkaline Phosphatase 105 38 - 126 U/L   Total Bilirubin 0.6 0.3 - 1.2 mg/dL   GFR calc non Af Amer >60 >60 mL/min   GFR calc Af Amer >60 >60 mL/min   Anion gap 10 5 - 15  Type and screen     Status: None   Collection Time: 06/17/20  8:15 AM  Result Value Ref Range   ABO/RH(D) O POS    Antibody Screen NEG    Sample Expiration      06/20/2020,2359 Performed at [facility], [address] [place], [place]     Pertinent Results:  Prenatal Labs: Blood type/Rh  O pos  Antibody screen neg  Rubella Immune  Varicella Immune  RPR NR  HBsAg Neg  HIV NR  GC neg  Chlamydia neg  Genetic screening  cfDNA negative, XX  1 hour GTT  129  3 hour GTT  n/a  GBS  negative   FHT: 150bpm, mod variability, + accels, no decels TOCO: irregular UI  SVE:  Dilation: 2 / Effacement (%): Thick / Station: -2    Cephalic by leopolds and SVE  No results found.  Assessment:  Patricia Duarte May Patricia Duarte is a 29 y.o. G25P1001 female at [redacted]w[redacted]d with IOL for [facility] on medications. .   Plan:  1. Admit  to Labor & Delivery; consents reviewed and obtained - COVID negative - monitor BP closely, labs stable on admit. BP mild range on admit, 140/80s  2.  Fetal Well being  - Fetal Tracing: Cat I tracing - Group B Streptococcus ppx indicated: negative - Presentation: cephalic confirmed by exam and Leopolds   3. Routine OB: - Prenatal labs reviewed, as above - Rh O Pos - CBC, T&S, RPR on admit - Clear fluids, IVF  4. Induction of Labor -  Contractions: external toco in place -  Pelvis proven to 7#14 -  Plan for induction with cytotec, AROM and Pitocin -  Plan for continuous fetal monitoring  -  Maternal pain control as desired - Anticipate vaginal delivery  5. Post Partum Planning: - Infant feeding: breast - Contraception: BTL - Tdap 04/15/20 - Flu: 06/13/20  Prudencio Pair Marlaine Arey, CNM 06/17/20 9:45 AM

## 2020-06-17 NOTE — Anesthesia Preprocedure Evaluation (Signed)
Anesthesia Evaluation  Patient identified by MRN, date of birth, ID band Patient awake    Reviewed: Allergy & Precautions, H&P , NPO status , Patient's Chart, lab work & pertinent test results  Airway Mallampati: II  TM Distance: >3 FB Neck ROM: full    Dental no notable dental hx. (+) Dental Advidsory Given   Pulmonary asthma , former smoker,           Cardiovascular hypertension,      Neuro/Psych negative neurological ROS  negative psych ROS   GI/Hepatic Neg liver ROS, GERD  ,  Endo/Other  negative endocrine ROS  Renal/GU negative Renal ROS  negative genitourinary   Musculoskeletal   Abdominal   Peds  Hematology negative hematology ROS (+)   Anesthesia Other Findings   Reproductive/Obstetrics (+) Pregnancy                             Anesthesia Physical Anesthesia Plan  ASA: II  Anesthesia Plan: Epidural   Post-op Pain Management:    Induction:   PONV Risk Score and Plan:   Airway Management Planned:   Additional Equipment:   Intra-op Plan:   Post-operative Plan:   Informed Consent: I have reviewed the patients History and Physical, chart, labs and discussed the procedure including the risks, benefits and alternatives for the proposed anesthesia with the patient or authorized representative who has indicated his/her understanding and acceptance.       Plan Discussed with: Anesthesiologist and CRNA  Anesthesia Plan Comments:         Anesthesia Quick Evaluation

## 2020-06-17 NOTE — Progress Notes (Signed)
Labor Progress Note  Patricia Duarte is a 29 y.o. G2P1001 at [redacted]w[redacted]d by LMP admitted for induction of labor due to Hypertension.  Subjective: comfortable after epidural  Objective: BP 120/73 (BP Location: Right Arm)   Pulse 77   Temp (!) 97.3 F (36.3 C) (Axillary)   Resp 15   Ht 5\' 7"  (1.702 m)   Wt 96.6 kg   LMP 09/28/2019   SpO2 100%   BMI 33.36 kg/m  Notable VS details: reviewed  Fetal Assessment: FHT:  FHR: 145 bpm, variability: moderate,  accelerations:  Present,  decelerations:  Absent Category/reactivity:  Category I UC:   regular, every 2 minutes; pitocin at 79mu/min SVE:   4/80/-1 per RN exam embrane status: AROM at 1254 Amniotic color: clear, small amt  Labs: Lab Results  Component Value Date   WBC 8.7 06/17/2020   HGB 12.3 06/17/2020   HCT 35.7 (L) 06/17/2020   MCV 90.8 06/17/2020   PLT 184 06/17/2020    Assessment / Plan: Induction of labor due to [facility],  progressing well on pitocin/AROM  Labor: s/p cytotec x 1, Pitocin and AROM. Progressing now.  Preeclampsia:  no e/o pre-e, labs stable on admit.  Fetal Wellbeing:  Category I Pain Control:  Epidural I/D:  n/a Anticipated MOD:  NSVD  [facility], CNM 06/17/2020, 5:51 PM

## 2020-06-17 NOTE — Anesthesia Procedure Notes (Signed)
Epidural Patient location during procedure: OB Start time: 06/17/2020 3:22 PM End time: 06/17/2020 3:36 PM  Staffing Anesthesiologist: Corinda Gubler, MD Resident/CRNA: Elmarie Mainland, CRNA Performed: resident/CRNA   Preanesthetic Checklist Completed: patient identified, IV checked, site marked, risks and benefits discussed, surgical consent, monitors and equipment checked, pre-op evaluation and timeout performed  Epidural Patient position: sitting Prep: ChloraPrep Patient monitoring: continuous pulse ox and blood pressure Approach: midline Location: L3-L4 Injection technique: LOR saline  Needle:  Needle type: Tuohy  Needle gauge: 17 G Needle length: 9 cm and 9 Needle insertion depth: 8 cm Catheter type: closed end flexible Catheter size: 19 Gauge Catheter at skin depth: 13 cm Test dose: negative and 1.5% lidocaine with Epi 1:200 K  Assessment Sensory level: T10 Events: blood not aspirated, injection not painful, no injection resistance, no paresthesia and negative IV test  Additional Notes 2 attempts Pt. Evaluated and documentation done after procedure finished. Patient identified. Risks/Benefits/Options discussed with patient including but not limited to bleeding, infection, nerve damage, paralysis, failed block, incomplete pain control, headache, blood pressure changes, nausea, vomiting, reactions to medication both or allergic, itching and postpartum back pain. Confirmed with bedside nurse the patient's most recent platelet count. Confirmed with patient that they are not currently taking any anticoagulation, have any bleeding history or any family history of bleeding disorders. Patient expressed understanding and wished to proceed. All questions were answered. Sterile technique was used throughout the entire procedure. Please see nursing notes for vital signs. Test dose was given through epidural catheter and negative prior to continuing to dose epidural or start infusion.  Warning signs of high block given to the patient including shortness of breath, tingling/numbness in hands, complete motor block, or any concerning symptoms with instructions to call for help. Patient was given instructions on fall risk and not to get out of bed. All questions and concerns addressed with instructions to call with any issues or inadequate analgesia.   Patient tolerated the insertion well without immediate complications.Reason for block:procedure for pain

## 2020-06-17 NOTE — Discharge Summary (Signed)
Obstetrical Discharge Summary  Patient Name: Patricia Duarte May [name] DOB: 1991/06/25 MRN: [id number]  Date of Admission: 06/17/2020 Date of Delivery: 06/17/20 Delivered by: Heloise Ochoa CNM Date of Discharge: 06/19/2020  Primary OB: [facility] [id number] last menstrual period was 09/28/2019. EDC Estimated Date of Delivery: 07/04/20 Gestational Age at Delivery: [redacted]w[redacted]d   Antepartum complications:  1.Chronic hypertension, taking labetalol 200mg  PO BID 2. Prepregnancy BMI 29.6 3. Trich in early pregnancy, treated 4. 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 S>D at 30 weeks, growth u/s 9/1 EFW:6lb7oz 2921g 55%,AFI:12.70cm  Admitting Diagnosis: IOL for CHTN, 37wks Secondary Diagnosis: SVD  Patient Active Problem List   Diagnosis Date Noted  . Chronic hypertension affecting pregnancy 06/17/2020  . Leakage of amniotic fluid 05/23/2020  . Abdominal pain affecting pregnancy 05/05/2020  . Hyperglycemia 03/17/2017  . Hypertriglyceridemia 04/20/2016  . Obesity, Class II, BMI 35-39.9 11/04/2015  . Acute pain of left wrist 11/04/2015  . Dermatitis 08/04/2015  . Contraception management 08/04/2015  . Allergic rhinitis 08/04/2015  . Migraine headache without aura 05/02/2015  . Nonintractable episodic headache 05/02/2015  . Benign hypertension 05/02/2015    Augmentation: AROM, Pitocin and Cytotec Complications: None Intrapartum complications/course: see delivery note Date of Delivery: 06/17/20 Delivered By: 06/19/20 CNM Delivery Type: spontaneous vaginal delivery Anesthesia: epidural Placenta: spontaneous Laceration: right labial Episiotomy: none Newborn Data: Live born female "Skylar" Birth Weight:  6lb 15.8oz, 3170g APGAR: 8, 9  Newborn Delivery   Birth date/time: 06/17/2020 20:44:00 Delivery type: Vaginal, Spontaneous      Postpartum Procedures: BTL 06/18/20  Edinburgh:  Edinburgh Postnatal Depression Scale Screening Tool 06/18/2020  I have  been able to laugh and see the funny side of things. 0  I have looked forward with enjoyment to things. 0  I have blamed myself unnecessarily when things went wrong. 1  I have been anxious or worried for no good reason. 0  I have felt scared or panicky for no good reason. 0  Things have been getting on top of me. 0  I have been so unhappy that I have had difficulty sleeping. 0  I have felt sad or miserable. 0  I have been so unhappy that I have been crying. 0  The thought of harming myself has occurred to me. 0  Edinburgh Postnatal Depression Scale Total 1      Post partum course:  Patient had an uncomplicated postpartum course.  By time of discharge on PPD#2, her pain was controlled on oral pain medications; she had appropriate lochia and was ambulating, voiding without difficulty and tolerating regular diet.  She was deemed stable for discharge to home.    Discharge Physical Exam:  BP 131/84   Pulse 85   Temp 97.8 F (36.6 C) (Oral)   Resp 18   Ht 5\' 7"  (1.702 m)   Wt 96.6 kg   LMP 09/28/2019   SpO2 99%   Breastfeeding Unknown   BMI 33.36 kg/m   General: NAD CV: RRR Pulm: CTABL, nl effort ABD: s/nd/nt, fundus firm and below the umbilicus Lochia: moderate Incision: c/d/i, healing well, no significant drainage, no dehiscence, no significant erythema Perineum: well approximated/intact DVT Evaluation: LE non-ttp, no evidence of DVT on exam.  Hemoglobin  Date Value Ref Range Status  06/19/2020 10.9 (L) 12.0 - 15.0 g/dL Final  09/30/2019 06/21/2020 11.1 - 15.9 g/dL Final   HCT  Date Value Ref Range Status  06/19/2020 33.4 (L) 36 - 46 % Final   Hematocrit  Date  Value Ref Range Status  05/02/2015 41.2 34.0 - 46.6 % Final     Disposition: stable, discharge to home. Baby Feeding: breastmilk Baby Disposition: home with mom  Rh Immune globulin given: n/a Rubella vaccine given: immune Varicella vaccine given: immune Tdap vaccine given in AP or PP setting: 04/15/20 Flu  vaccine given in AP or PP setting: 06/13/20  Contraception: BTL  Prenatal Labs:  Blood type/Rh  O pos  Antibody screen neg  Rubella Immune  Varicella Immune  RPR NR  HBsAg Neg  HIV NR  GC neg  Chlamydia neg  Genetic screening  cfDNA negative, XX  1 hour GTT  129  3 hour GTT  n/a  GBS  negative      Plan:  Loel Dubonnet May Laveda Norman was discharged to home in good condition. Follow-up appointment  -5 days for blood pressure check  -Dr. Dalbert Garnet in 2 weeks for post op check  -Heloise Ochoa, CNM  in 6 weeks for postpartum visit.   Discharge Medications: Allergies as of 06/19/2020   No Known Allergies     Medication List    STOP taking these medications   amoxicillin 500 MG capsule Commonly known as: AMOXIL   aspirin 81 MG chewable tablet   lidocaine 2 % solution Commonly known as: XYLOCAINE   predniSONE 10 MG tablet Commonly known as: DELTASONE     TAKE these medications   acetaminophen 325 MG tablet Commonly known as: Tylenol Take 2 tablets (650 mg total) by mouth every 4 (four) hours as needed (for pain scale < 4).   fexofenadine-pseudoephedrine 60-120 MG 12 hr tablet Commonly known as: ALLEGRA-D Take 1 tablet by mouth 2 (two) times daily.   ibuprofen 600 MG tablet Commonly known as: ADVIL Take 1 tablet (600 mg total) by mouth every 6 (six) hours as needed for mild pain or moderate pain.   labetalol 200 MG tablet Commonly known as: NORMODYNE Take 1 tablet (200 mg total) by mouth 2 (two) times daily. What changed:   medication strength  how much to take  Another medication with the same name was removed. Continue taking this medication, and follow the directions you see here.   prenatal multivitamin Tabs tablet Take 1 tablet by mouth daily at 12 noon.   ProAir HFA 108 (90 Base) MCG/ACT inhaler Generic drug: albuterol Inhale into the lungs.        Follow-up Information    McVey, Prudencio Pair, CNM Follow up in 6 week(s).   Specialty: Obstetrics and  Gynecology Why: routine Postpartum appt Contact information: [address] [place] [address] [phone]        Christeen Douglas, MD. Call on 07/02/2020.   Specialty: Obstetrics and Gynecology Why: video visit through mychart @ 2:45 pm for postop check for tubal ligation Contact information: [address] [place] [address] [phone]        [facility] Follow up in 5 day(s).   Why: blood pressure check Contact information: [address] [place] [address] [phone]              Signed:  Al Corpus 06/19/2020  9:49 AM  Margaretmary Eddy, CNM Certified Nurse Midwife [facility]

## 2020-06-18 ENCOUNTER — Inpatient Hospital Stay: Payer: 59 | Admitting: Anesthesiology

## 2020-06-18 ENCOUNTER — Encounter: Admission: EM | Disposition: A | Discharge status: Home / Self Care | Payer: Self-pay | Source: Home / Self Care

## 2020-06-18 HISTORY — PX: TUBAL LIGATION: SHX77

## 2020-06-18 LAB — CBC
HCT: 33.6 % — ABNORMAL LOW (ref 36.0–46.0)
Hemoglobin: 11.5 g/dL — ABNORMAL LOW (ref 12.0–15.0)
MCH: 31.1 pg (ref 26.0–34.0)
MCHC: 34.2 g/dL (ref 30.0–36.0)
MCV: 90.8 fL (ref 80.0–100.0)
Platelets: 185 10*3/uL (ref 150–400)
RBC: 3.7 MIL/uL — ABNORMAL LOW (ref 3.87–5.11)
RDW: 14.4 % (ref 11.5–15.5)
WBC: 16.5 10*3/uL — ABNORMAL HIGH (ref 4.0–10.5)
nRBC: 0 % (ref 0.0–0.2)

## 2020-06-18 LAB — RPR: RPR Ser Ql: NONREACTIVE

## 2020-06-18 SURGERY — LIGATION, FALLOPIAN TUBE, POSTPARTUM
Anesthesia: General | Site: Abdomen

## 2020-06-18 MED ORDER — ONDANSETRON HCL 4 MG/2ML IJ SOLN
INTRAMUSCULAR | Status: DC | PRN
  Administered 2020-06-18: 4 mg via INTRAVENOUS

## 2020-06-18 MED ORDER — METOCLOPRAMIDE HCL 10 MG PO TABS
10.0000 mg | ORAL_TABLET | Freq: Once | ORAL | Status: AC
  Administered 2020-06-18: 10 mg via ORAL
  Filled 2020-06-18: qty 1

## 2020-06-18 MED ORDER — MIDAZOLAM HCL 2 MG/2ML IJ SOLN
INTRAMUSCULAR | Status: AC
  Filled 2020-06-18: qty 2

## 2020-06-18 MED ORDER — SENNOSIDES-DOCUSATE SODIUM 8.6-50 MG PO TABS
2.0000 | ORAL_TABLET | ORAL | Status: DC
  Administered 2020-06-18 (×2): 2 via ORAL
  Filled 2020-06-18 (×2): qty 2

## 2020-06-18 MED ORDER — OXYCODONE HCL 5 MG/5ML PO SOLN: 5.0000 mg | Freq: Once | ORAL | Status: DC | PRN

## 2020-06-18 MED ORDER — PROPOFOL 10 MG/ML IV BOLUS
INTRAVENOUS | Status: DC | PRN
  Administered 2020-06-18: 30 mg via INTRAVENOUS
  Administered 2020-06-18: 170 mg via INTRAVENOUS

## 2020-06-18 MED ORDER — PRENATAL MULTIVITAMIN CH
1.0000 | ORAL_TABLET | Freq: Every day | ORAL | Status: DC
  Administered 2020-06-18 – 2020-06-19 (×2): 1 via ORAL
  Filled 2020-06-18 (×2): qty 1

## 2020-06-18 MED ORDER — FENTANYL CITRATE (PF) 100 MCG/2ML IJ SOLN
INTRAMUSCULAR | Status: AC
  Filled 2020-06-18: qty 2

## 2020-06-18 MED ORDER — DIPHENHYDRAMINE HCL 25 MG PO CAPS: 25.0000 mg | ORAL_CAPSULE | Freq: Four times a day (QID) | ORAL | Status: DC | PRN

## 2020-06-18 MED ORDER — SUCCINYLCHOLINE CHLORIDE 20 MG/ML IJ SOLN
INTRAMUSCULAR | Status: DC | PRN
  Administered 2020-06-18: 120 mg via INTRAVENOUS

## 2020-06-18 MED ORDER — ZOLPIDEM TARTRATE 5 MG PO TABS: 5.0000 mg | ORAL_TABLET | Freq: Every evening | ORAL | Status: DC | PRN

## 2020-06-18 MED ORDER — COCONUT OIL OIL: 1.0000 "application " | TOPICAL_OIL | Status: DC | PRN

## 2020-06-18 MED ORDER — ACETAMINOPHEN 325 MG PO TABS
650.0000 mg | ORAL_TABLET | ORAL | Status: DC | PRN
  Administered 2020-06-19: 650 mg via ORAL
  Filled 2020-06-18: qty 2

## 2020-06-18 MED ORDER — MIDAZOLAM HCL 2 MG/2ML IJ SOLN
INTRAMUSCULAR | Status: DC | PRN
  Administered 2020-06-18: 2 mg via INTRAVENOUS

## 2020-06-18 MED ORDER — OXYCODONE HCL 5 MG PO TABS: 5.0000 mg | ORAL_TABLET | Freq: Once | ORAL | Status: DC | PRN

## 2020-06-18 MED ORDER — FAMOTIDINE 20 MG PO TABS
40.0000 mg | ORAL_TABLET | Freq: Once | ORAL | Status: AC
  Administered 2020-06-18: 40 mg via ORAL
  Filled 2020-06-18: qty 2

## 2020-06-18 MED ORDER — IBUPROFEN 600 MG PO TABS
600.0000 mg | ORAL_TABLET | Freq: Four times a day (QID) | ORAL | Status: DC
  Administered 2020-06-18 – 2020-06-19 (×7): 600 mg via ORAL
  Filled 2020-06-18 (×7): qty 1

## 2020-06-18 MED ORDER — SILVER SULFADIAZINE 1 % EX CREA
1.0000 "application " | TOPICAL_CREAM | Freq: Two times a day (BID) | CUTANEOUS | Status: DC
  Filled 2020-06-18: qty 85

## 2020-06-18 MED ORDER — LACTATED RINGERS IV SOLN: INTRAVENOUS | Status: DC

## 2020-06-18 MED ORDER — LACTATED RINGERS IV SOLN: INTRAVENOUS | Status: DC | PRN

## 2020-06-18 MED ORDER — ONDANSETRON HCL 4 MG PO TABS: 4.0000 mg | ORAL_TABLET | ORAL | Status: DC | PRN

## 2020-06-18 MED ORDER — SIMETHICONE 80 MG PO CHEW: 80.0000 mg | CHEWABLE_TABLET | ORAL | Status: DC | PRN

## 2020-06-18 MED ORDER — DIBUCAINE (PERIANAL) 1 % EX OINT: 1.0000 "application " | TOPICAL_OINTMENT | CUTANEOUS | Status: DC | PRN

## 2020-06-18 MED ORDER — ACETAMINOPHEN 10 MG/ML IV SOLN
INTRAVENOUS | Status: DC | PRN
  Administered 2020-06-18: 1000 mg via INTRAVENOUS

## 2020-06-18 MED ORDER — FENTANYL CITRATE (PF) 100 MCG/2ML IJ SOLN
INTRAMUSCULAR | Status: DC | PRN
Start: 2020-06-18 — End: 2020-06-18
  Administered 2020-06-18 (×2): 50 ug via INTRAVENOUS

## 2020-06-18 MED ORDER — DEXMEDETOMIDINE (PRECEDEX) IN NS 20 MCG/5ML (4 MCG/ML) IV SYRINGE
PREFILLED_SYRINGE | INTRAVENOUS | Status: DC | PRN
  Administered 2020-06-18: 20 ug via INTRAVENOUS

## 2020-06-18 MED ORDER — BUPIVACAINE HCL 0.5 % IJ SOLN
INTRAMUSCULAR | Status: DC | PRN
  Administered 2020-06-18: 4 mL
  Administered 2020-06-18: 5 mL

## 2020-06-18 MED ORDER — PROMETHAZINE HCL 25 MG/ML IJ SOLN: 6.2500 mg | INTRAMUSCULAR | Status: DC | PRN

## 2020-06-18 MED ORDER — BENZOCAINE-MENTHOL 20-0.5 % EX AERO: 1.0000 "application " | INHALATION_SPRAY | CUTANEOUS | Status: DC | PRN

## 2020-06-18 MED ORDER — PROPOFOL 10 MG/ML IV BOLUS
INTRAVENOUS | Status: AC
  Filled 2020-06-18: qty 20

## 2020-06-18 MED ORDER — WITCH HAZEL-GLYCERIN EX PADS: 1.0000 "application " | MEDICATED_PAD | CUTANEOUS | Status: DC | PRN

## 2020-06-18 MED ORDER — OXYCODONE HCL 5 MG PO TABS: 5.0000 mg | ORAL_TABLET | ORAL | Status: DC | PRN

## 2020-06-18 MED ORDER — FENTANYL CITRATE (PF) 100 MCG/2ML IJ SOLN: 25.0000 ug | INTRAMUSCULAR | Status: DC | PRN

## 2020-06-18 MED ORDER — LIDOCAINE HCL (CARDIAC) PF 100 MG/5ML IV SOSY
PREFILLED_SYRINGE | INTRAVENOUS | Status: DC | PRN
  Administered 2020-06-18: 100 mg via INTRAVENOUS

## 2020-06-18 MED ORDER — DEXAMETHASONE SODIUM PHOSPHATE 10 MG/ML IJ SOLN
INTRAMUSCULAR | Status: DC | PRN
  Administered 2020-06-18: 10 mg via INTRAVENOUS

## 2020-06-18 MED ORDER — ONDANSETRON HCL 4 MG/2ML IJ SOLN: 4.0000 mg | INTRAMUSCULAR | Status: DC | PRN

## 2020-06-18 SURGICAL SUPPLY — 33 items
BLADE SURG SZ11 CARB STEEL (BLADE) ×3 IMPLANT
CANISTER SUCT 1200ML W/VALVE (MISCELLANEOUS) ×3 IMPLANT
CHLORAPREP W/TINT 26 (MISCELLANEOUS) ×3 IMPLANT
COVER WAND RF STERILE (DRAPES) IMPLANT
DERMABOND ADVANCED (GAUZE/BANDAGES/DRESSINGS) ×2
DERMABOND ADVANCED .7 DNX12 (GAUZE/BANDAGES/DRESSINGS) ×1 IMPLANT
DRAPE LAPAROTOMY 100X77 ABD (DRAPES) ×3 IMPLANT
ELECT REM PT RETURN 9FT ADLT (ELECTROSURGICAL) ×3
ELECTRODE REM PT RTRN 9FT ADLT (ELECTROSURGICAL) ×1 IMPLANT
GLOVE BIO SURGEON STRL SZ7 (GLOVE) ×6 IMPLANT
GLOVE INDICATOR 7.5 STRL GRN (GLOVE) ×6 IMPLANT
GOWN STRL REUS W/ TWL LRG LVL3 (GOWN DISPOSABLE) ×2 IMPLANT
GOWN STRL REUS W/TWL LRG LVL3 (GOWN DISPOSABLE) ×4
KIT TURNOVER CYSTO (KITS) ×3 IMPLANT
LABEL OR SOLS (LABEL) ×3 IMPLANT
LIGASURE IMPACT 36 18CM CVD LR (INSTRUMENTS) ×3 IMPLANT
NEEDLE HYPO 22GX1.5 SAFETY (NEEDLE) ×3 IMPLANT
NS IRRIG 500ML POUR BTL (IV SOLUTION) ×3 IMPLANT
PACK BASIN MINOR (MISCELLANEOUS) ×3 IMPLANT
PAD OB MATERNITY 4.3X12.25 (PERSONAL CARE ITEMS) ×3 IMPLANT
RETRACTOR RING XSMALL (MISCELLANEOUS) ×1 IMPLANT
RETRACTOR WOUND ALXS 18CM SML (MISCELLANEOUS) IMPLANT
RTRCTR WOUND ALEXIS 13CM XS SH (MISCELLANEOUS) ×3
RTRCTR WOUND ALEXIS O 18CM SML (MISCELLANEOUS)
SPONGE LAP 4X18 RFD (DISPOSABLE) ×3 IMPLANT
SUT CHROMIC GUT BROWN 0 54 (SUTURE) ×1 IMPLANT
SUT CHROMIC GUT BROWN 0 54IN (SUTURE) ×3
SUT MNCRL 4-0 (SUTURE) ×2
SUT MNCRL 4-0 27XMFL (SUTURE) ×1
SUT VIC AB 0 CT2 27 (SUTURE) ×3 IMPLANT
SUT VICRYL 0 AB UR-6 (SUTURE) ×6 IMPLANT
SUTURE MNCRL 4-0 27XMF (SUTURE) ×1 IMPLANT
SYR 10ML LL (SYRINGE) ×3 IMPLANT

## 2020-06-18 NOTE — Progress Notes (Signed)
Post Partum Day 1  Subjective: Doing well, no concerns. Ambulating without difficulty, pain managed with PO meds, tolerating regular diet, and voiding without difficulty.   No fever/chills, chest pain, shortness of breath, nausea/vomiting, or leg pain. No nipple or breast pain.  Objective: BP 118/80 (BP Location: Right Arm)   Pulse 76   Temp 97.9 F (36.6 C) (Oral)   Resp 18   Ht 5\' 7"  (1.702 m)   Wt 96.6 kg   LMP 09/28/2019   SpO2 99%   Breastfeeding Unknown   BMI 33.36 kg/m    Physical Exam:  General: alert, cooperative, appears stated age and no distress Breasts: soft/nontender CV: RRR Pulm: nl effort, CTABL Abdomen: soft, non-tender, active bowel sounds Uterine Fundus: firm Lochia: appropriate DVT Evaluation: No evidence of DVT seen on physical exam.  No cords or calf tenderness. No significant calf/ankle edema.  Recent Labs    06/17/20 0815 06/18/20 0512  HGB 12.3 11.5*  HCT 35.7* 33.6*  WBC 8.7 16.5*  PLT 184 185    Assessment/Plan: 29 y.o. 06/20/20 postpartum day # 1  -Continue routine postpartum care -Lactation consult PRN for breastfeeding -BTL scheduled for later today -Immunization status: all immunizations up to date  Disposition: Continue inpatient postpartum care   LOS: 1 day   [id number], CNM 06/18/2020, 8:26 AM   ----- 06/20/2020 Certified Nurse Midwife [facility]

## 2020-06-18 NOTE — Op Note (Signed)
Patricia Duarte [name] 06/17/2020 - 06/18/2020  PREOPERATIVE DIAGNOSES: Multiparity, undesired fertility  POSTOPERATIVE DIAGNOSES: Multiparity, undesired fertility  PROCEDURE:  Postpartum Bilateral Tubal Sterilization by bilateral partial salpingectomy, including fimbriae  SURGEON: Dr. Christeen Douglas  ANESTHESIA:  GETA and local analgesia using 10 ml of 0.5% Marcaine  Anesthesiologist: Karleen Hampshire, MD Anesthesiologist: Karleen Hampshire, MD CRNA: Waldo Laine, CRNA  COMPLICATIONS:  None immediate.  ESTIMATED BLOOD LOSS: minimal.  FLUIDS: 300 ml LR.  URINE OUTPUT:  Not assessed  INDICATIONS:  29 y.o. [id number] with undesired fertility,status post vaginal delivery, desires permanent sterilization.  Other reversible forms of contraception were discussed with patient; she declines all other modalities. Risks of procedure discussed with patient including but not limited to: risk of regret, permanence of method, bleeding, infection, injury to surrounding organs and need for additional procedures.  Failure risk of 1 -2 % with increased risk of ectopic gestation if pregnancy occurs was also discussed with patient.      FINDINGS:  Normal uterus, tubes, and ovaries.  PROCEDURE DETAILS:  The patient was taken to the operating room where her epidural anesthesia was dosed up to surgical level and found to be adequate. She was then placed in the dorsal supine position and prepped and draped in sterile fashion. After an adequate timeout was performed, attention was turned to the patient's abdomen where a small transverse skin incision was made under the umbilical fold. The incision was taken down to the layer of fascia using the scalpel, and fascia was incised, and extended bilaterally using Mayo scissors. The peritoneum was entered in a sharp fashion. Attention was then turned to the patient's uterus, and left fallopian tube was identified and followed out to the fimbriated end. Using a Ligasure  bipolar cautery device, a portion of the left tube including the fimbriated end was cauterized and sharply excised. A similar process was carried out on the right side allowing for bilateral tubal sterilization. Good hemostasis was noted overall. Local analgesia was poured over both adenexa.The instruments were then removed from the patient's abdomen and the fascial incision was repaired with 0 Vicryl, and the skin was closed with a 4-0 Vicryl subcuticular stitch. Ligasure burn possible at inferior edge of incision, though no blanching.The patient tolerated the procedure well. Instrument, sponge, and needle counts were correct times two. The patient was then taken to the recovery room awake and in stable condition.

## 2020-06-18 NOTE — Lactation Note (Signed)
This note was copied from a baby's chart. Lactation Consultation Note  Patient Name: Patricia Duarte Today's Date: 06/18/2020 Reason for consult: Initial assessment;Early term 37-38.6wks  Lactation visit. This is mom's second baby, SVD 13hrs ago. Mom breastfed her first child for 76yrs with initial difficulties with milk coming in until day 7. Baby has had several feeds since delivery and already voided/stooled beyond minimum expectations. Mom reporting tugging, no pain or discomfort. Baby was at the breast when [facility] entered: flanged top/bottom lip with rhythmic vigorous sucking pattern, relaxed body language, unlatched on her own, nipple appearing round.  Mom plans for tubal ligation this evening around 7pm, and declines supplement but open to hand expression prior to going and possibility of gloved finger feed if needed while gone. LC reviewed breastfeeding basics: newborn stomach size, feeding patterns and newborn behaviors, output expectations, milk supply and demand, and normal course of lactation.  Lactation name/number updated on whiteboard. Encouraged to call with questions or for assistance if needed.   Maternal Data Formula Feeding for Exclusion: No Has patient been taught Hand Expression?: Yes Does the patient have breastfeeding experience prior to this delivery?: Yes  Feeding Feeding Type: Breast Fed  LATCH Score Latch: Grasps breast easily, tongue down, lips flanged, rhythmical sucking.  Audible Swallowing: A few with stimulation  Type of Nipple: Everted at rest and after stimulation  Comfort (Breast/Nipple): Soft / non-tender  Hold (Positioning): No assistance needed to correctly position infant at breast.  LATCH Score: 9  Interventions Interventions: Breast feeding basics reviewed  Lactation Tools Discussed/Used     Consult Status Consult Status: PRN    Danford Bad 06/18/2020, 10:42 AM

## 2020-06-18 NOTE — Anesthesia Preprocedure Evaluation (Signed)
Anesthesia Evaluation  Patient identified by MRN, date of birth, ID band Patient awake    Reviewed: Allergy & Precautions, H&P , NPO status , Patient's Chart, lab work & pertinent test results  History of Anesthesia Complications Negative for: history of anesthetic complications  Airway Mallampati: II  TM Distance: >3 FB Neck ROM: full    Dental  (+) Teeth Intact   Pulmonary asthma , neg sleep apnea, neg COPD, former smoker,    breath sounds clear to auscultation       Cardiovascular hypertension, (-) angina(-) Past MI and (-) Cardiac Stents negative cardio ROS  (-) dysrhythmias  Rhythm:regular Rate:Normal     Neuro/Psych  Headaches, negative psych ROS   GI/Hepatic negative GI ROS, Neg liver ROS,   Endo/Other  negative endocrine ROS  Renal/GU      Musculoskeletal   Abdominal   Peds  Hematology negative hematology ROS (+)   Anesthesia Other Findings Past Medical History: No date: Asthma No date: Hypertension     Comment:  Chronic  Past Surgical History: 2008: CHOLECYSTECTOMY  BMI    Body Mass Index: 33.36 kg/m      Reproductive/Obstetrics negative OB ROS                             Anesthesia Physical Anesthesia Plan  ASA: II  Anesthesia Plan: General ETT   Post-op Pain Management:    Induction:   PONV Risk Score and Plan: Ondansetron, Dexamethasone, Midazolam and Treatment may vary due to age or medical condition  Airway Management Planned:   Additional Equipment:   Intra-op Plan:   Post-operative Plan:   Informed Consent: I have reviewed the patients History and Physical, chart, labs and discussed the procedure including the risks, benefits and alternatives for the proposed anesthesia with the patient or authorized representative who has indicated his/her understanding and acceptance.     Dental Advisory Given  Plan Discussed with: Anesthesiologist, CRNA and  Surgeon  Anesthesia Plan Comments:         Anesthesia Quick Evaluation

## 2020-06-18 NOTE — Anesthesia Postprocedure Evaluation (Signed)
Anesthesia Post Note  Patient: Patricia Duarte  Procedure(s) Performed: AN AD HOC LABOR EPIDURAL  Patient location during evaluation: Mother Baby Anesthesia Type: Epidural Level of consciousness: awake and alert Pain management: pain level controlled Vital Signs Assessment: post-procedure vital signs reviewed and stable Respiratory status: spontaneous breathing, nonlabored ventilation and respiratory function stable Cardiovascular status: stable Postop Assessment: no headache, no backache and epidural receding Anesthetic complications: no   No complications documented.   Last Vitals:  Vitals:   06/18/20 0350 06/18/20 0820  BP: 108/64 118/80  Pulse: 96 76  Resp: 20 18  Temp: 36.6 C 36.6 C  SpO2: 99% 99%    Last Pain:  Vitals:   06/18/20 0820  TempSrc: Oral  PainSc:                  Elmarie Mainland

## 2020-06-18 NOTE — Anesthesia Procedure Notes (Signed)
Procedure Name: Intubation Date/Time: 06/18/2020 7:49 PM Performed by: Waldo Laine, CRNA Pre-anesthesia Checklist: Patient identified, Patient being monitored, Timeout performed, Emergency Drugs available and Suction available Patient Re-evaluated:Patient Re-evaluated prior to induction Oxygen Delivery Method: Circle system utilized Preoxygenation: Pre-oxygenation with 100% oxygen Induction Type: IV induction and Rapid sequence Laryngoscope Size: 3 and McGraph Grade View: Grade I Tube type: Oral Tube size: 7.0 mm Number of attempts: 1 Airway Equipment and Method: Stylet Placement Confirmation: ETT inserted through vocal cords under direct vision,  positive ETCO2 and breath sounds checked- equal and bilateral Secured at: 21 cm Tube secured with: Tape Dental Injury: Teeth and Oropharynx as per pre-operative assessment

## 2020-06-18 NOTE — Progress Notes (Signed)
Patient desires permanent sterilization.  Other reversible forms of contraception were discussed with patient; she declines all other modalities. Risks of procedure discussed with patient including but not limited to: risk of regret, permanence of method, bleeding, infection, injury to surrounding organs and need for additional procedures.  Failure risk of 1-2 % with increased risk of ectopic gestation if pregnancy occurs was also discussed with patient.  Patient verbalized understanding of these risks and wants to proceed with sterilization.  Written informed consent obtained.  To OR when ready. ° °Tubal ligation °

## 2020-06-18 NOTE — Transfer of Care (Addendum)
Immediate Anesthesia Transfer of Care Note  Patient: Patricia Duarte  Procedure(s) Performed: POST PARTUM TUBAL LIGATION (N/A Abdomen)  Patient Location: PACU  Anesthesia Type:General  Level of Consciousness: oriented, sedated and patient cooperative  Airway & Oxygen Therapy: Patient Spontanous Breathing and Patient connected to nasal cannula oxygen  Post-op Assessment: Report given to RN and Post -op Vital signs reviewed and stable  Post vital signs: Reviewed and stable  Last Vitals:  Vitals Value Taken Time  BP 130/89 06/18/20 2045  Temp 36.1 C 06/18/20 2045  Pulse 87 06/18/20 2047  Resp 15 06/18/20 2047  SpO2 89 % 06/18/20 2047  Vitals shown include unvalidated device data.  Last Pain:  Vitals:   06/18/20 1708  TempSrc:   PainSc: 2          Complications: No complications documented.

## 2020-06-19 ENCOUNTER — Encounter: Payer: Self-pay | Admitting: Obstetrics and Gynecology

## 2020-06-19 ENCOUNTER — Ambulatory Visit: Payer: Self-pay

## 2020-06-19 LAB — CBC
HCT: 33.4 % — ABNORMAL LOW (ref 36.0–46.0)
Hemoglobin: 10.9 g/dL — ABNORMAL LOW (ref 12.0–15.0)
MCH: 31.2 pg (ref 26.0–34.0)
MCHC: 32.6 g/dL (ref 30.0–36.0)
MCV: 95.7 fL (ref 80.0–100.0)
Platelets: 195 10*3/uL (ref 150–400)
RBC: 3.49 MIL/uL — ABNORMAL LOW (ref 3.87–5.11)
RDW: 14.8 % (ref 11.5–15.5)
WBC: 10.1 10*3/uL (ref 4.0–10.5)
nRBC: 0 % (ref 0.0–0.2)

## 2020-06-19 MED ORDER — LABETALOL HCL 200 MG PO TABS: 200.0000 mg | ORAL_TABLET | Freq: Two times a day (BID) | ORAL | 1 refills | Status: AC

## 2020-06-19 MED ORDER — IBUPROFEN 600 MG PO TABS: 600.0000 mg | ORAL_TABLET | Freq: Four times a day (QID) | ORAL | 1 refills | Status: DC | PRN

## 2020-06-19 MED ORDER — ACETAMINOPHEN 325 MG PO TABS: 650.0000 mg | ORAL_TABLET | ORAL | Status: DC | PRN

## 2020-06-19 NOTE — Discharge Instructions (Signed)

## 2020-06-19 NOTE — Anesthesia Postprocedure Evaluation (Signed)
Anesthesia Post Note  Patient: Patricia Duarte  Procedure(s) Performed: POST PARTUM TUBAL LIGATION (N/A Abdomen)  Patient location during evaluation: PACU Anesthesia Type: General Level of consciousness: awake and alert Pain management: pain level controlled Vital Signs Assessment: post-procedure vital signs reviewed and stable Respiratory status: spontaneous breathing, nonlabored ventilation and respiratory function stable Cardiovascular status: blood pressure returned to baseline and stable Postop Assessment: no apparent nausea or vomiting Anesthetic complications: no   No complications documented.   Last Vitals:  Vitals:   06/18/20 2252 06/19/20 0355  BP: (!) 146/90 114/71  Pulse: 95 82  Resp: 18 18  Temp: 36.6 C 36.9 C  SpO2: 97% 98%    Last Pain:  Vitals:   06/19/20 0355  TempSrc: Oral  PainSc:                  Karleen Hampshire

## 2020-06-19 NOTE — Lactation Note (Signed)
This note was copied from a baby's chart. Lactation Consultation Note  Patient Name: Patricia Duarte Today's Date: 06/19/2020    Lactation follow-up before discharge. Mom continues to report breastfeeding to be going well, feels that her breasts are starting to become heavier and leaking when she is leaning forward.  LC reviewed BF basics for mom and baby in the days to come: stomach size, feeding behaviors, cluster feeding, milk supply and demand, normal course of lactation, and breast fullness/engorgement and management of both. Signs and symptoms given for plugged ducts and mastitis and when to seek MD care. Encouraged continued feeding on demand and delay of bottles/pacifier use for up to 4 weeks. Mom plans to begin pumping at week 2 for milk storage and bottle introduction 1-2x/day at week 3-4 for support and help. Information given for outpatient lactation services and community breastfeeding resources.    Danford Bad 06/19/2020, 12:26 PM

## 2020-06-19 NOTE — Progress Notes (Signed)
Patient discharged home with infant. Discharge instructions and prescriptions given and reviewed with patient. Patient verbalized understanding. Escorted out by auxillary.  

## 2020-06-20 ENCOUNTER — Observation Stay
Admission: EM | Admit: 2020-06-20 | Discharge: 2020-06-20 | Disposition: A | Discharge status: Home / Self Care | Payer: 59 | Attending: Obstetrics and Gynecology | Admitting: Obstetrics and Gynecology

## 2020-06-20 ENCOUNTER — Other Ambulatory Visit: Payer: Self-pay | Admitting: Obstetrics and Gynecology

## 2020-06-20 DIAGNOSIS — O165 Unspecified maternal hypertension, complicating the puerperium: Principal | ICD-10-CM | POA: Insufficient documentation

## 2020-06-20 DIAGNOSIS — R030 Elevated blood-pressure reading, without diagnosis of hypertension: Secondary | ICD-10-CM | POA: Diagnosis present

## 2020-06-20 LAB — COMPREHENSIVE METABOLIC PANEL
ALT: 18 U/L (ref 0–44)
AST: 28 U/L (ref 15–41)
Albumin: 2.8 g/dL — ABNORMAL LOW (ref 3.5–5.0)
Alkaline Phosphatase: 78 U/L (ref 38–126)
Anion gap: 10 (ref 5–15)
BUN: 12 mg/dL (ref 6–20)
CO2: 21 mmol/L — ABNORMAL LOW (ref 22–32)
Calcium: 8.9 mg/dL (ref 8.9–10.3)
Chloride: 108 mmol/L (ref 98–111)
Creatinine, Ser: 0.88 mg/dL (ref 0.44–1.00)
GFR calc Af Amer: >60 mL/min (ref >60)
GFR calc non Af Amer: >60 mL/min (ref >60)
Glucose, Bld: 75 mg/dL (ref 70–99)
Potassium: 3.9 mmol/L (ref 3.5–5.1)
Sodium: 139 mmol/L (ref 135–145)
Total Bilirubin: 0.5 mg/dL (ref 0.3–1.2)
Total Protein: 6.1 g/dL — ABNORMAL LOW (ref 6.5–8.1)

## 2020-06-20 LAB — CBC WITH DIFFERENTIAL/PLATELET
Abs Immature Granulocytes: 0.08 10*3/uL — ABNORMAL HIGH (ref 0.00–0.07)
Basophils Absolute: 0 10*3/uL (ref 0.0–0.1)
Basophils Relative: 1 %
Eosinophils Absolute: 0.4 10*3/uL (ref 0.0–0.5)
Eosinophils Relative: 6 %
HCT: 33.2 % — ABNORMAL LOW (ref 36.0–46.0)
Hemoglobin: 10.8 g/dL — ABNORMAL LOW (ref 12.0–15.0)
Immature Granulocytes: 1 %
Lymphocytes Relative: 14 %
Lymphs Abs: 1 10*3/uL (ref 0.7–4.0)
MCH: 30.9 pg (ref 26.0–34.0)
MCHC: 32.5 g/dL (ref 30.0–36.0)
MCV: 95.1 fL (ref 80.0–100.0)
Monocytes Absolute: 0.8 10*3/uL (ref 0.1–1.0)
Monocytes Relative: 12 %
Neutro Abs: 4.6 10*3/uL (ref 1.7–7.7)
Neutrophils Relative %: 66 %
Platelets: 189 10*3/uL (ref 150–400)
RBC: 3.49 MIL/uL — ABNORMAL LOW (ref 3.87–5.11)
RDW: 14.6 % (ref 11.5–15.5)
WBC: 6.9 10*3/uL (ref 4.0–10.5)
nRBC: 0 % (ref 0.0–0.2)

## 2020-06-20 LAB — PROTEIN / CREATININE RATIO, URINE
Creatinine, Urine: 100 mg/dL
Protein Creatinine Ratio: 0.13 mg/mg{Cre} (ref 0.00–0.15)
Total Protein, Urine: 13 mg/dL

## 2020-06-20 LAB — SURGICAL PATHOLOGY

## 2020-06-20 MED ORDER — LABETALOL HCL 5 MG/ML IV SOLN: 40.0000 mg | INTRAVENOUS | Status: DC | PRN

## 2020-06-20 MED ORDER — HYDRALAZINE HCL 20 MG/ML IJ SOLN: 10.0000 mg | INTRAMUSCULAR | Status: DC | PRN

## 2020-06-20 MED ORDER — LABETALOL HCL 5 MG/ML IV SOLN: 80.0000 mg | INTRAVENOUS | Status: DC | PRN

## 2020-06-20 MED ORDER — LABETALOL HCL 5 MG/ML IV SOLN: 20.0000 mg | INTRAVENOUS | Status: DC | PRN

## 2020-06-20 MED ORDER — FUROSEMIDE 20 MG PO TABS: 20.0000 mg | ORAL_TABLET | Freq: Every day | ORAL | 11 refills | Status: AC

## 2020-06-20 NOTE — Progress Notes (Signed)
Pt given discharge paperwork including new prescription and follow up care. Pt verbalized understanding

## 2020-06-20 NOTE — Discharge Summary (Signed)
Gynecological Discharge Summary  Patient Name: Patricia Duarte DOB: 09-Dec-1990 MRN: [id number]  Date of Admission: 06/20/2020 Date of Discharge: 06/20/2020  Hospital course:   The patient was sent from the office to triage for elevated BPs 3 days PP.  Labs were all normal and reassuring. Discussed increasing her Labetalol to 200mg  BID and adding Lasix 20mg  QD.  Pt voiced understanding and agreed.  Discharge Physical Exam:  BP 140/84   Pulse 76    Today's Vitals   06/20/20 1642 06/20/20 1657 06/20/20 1712 06/20/20 1727  BP: 133/87 (!) 147/83 138/83 140/84  Pulse: 76 70 80 76   There is no height or weight on file to calculate BMI.  General: NAD CV: RRR Pulm: nl effort ABD: s/nd/nt DVT Evaluation: LE non-ttp, no evidence of DVT on exam.  Results for orders placed or performed during the hospital encounter of 06/20/20 (from the past 24 hour(s))  CBC with Differential     Status: Abnormal   Collection Time: 06/20/20  4:39 PM  Result Value Ref Range   WBC 6.9 4.0 - 10.5 K/uL   RBC 3.49 (L) 3.87 - 5.11 MIL/uL   Hemoglobin 10.8 (L) 12.0 - 15.0 g/dL   HCT 06/22/20 (L) 36 - 46 %   MCV 95.1 80.0 - 100.0 fL   MCH 30.9 26.0 - 34.0 pg   MCHC 32.5 30.0 - 36.0 g/dL   RDW 06/22/20 15.7 - 26.2 %   Platelets 189 150 - 400 K/uL   nRBC 0.0 0.0 - 0.2 %   Neutrophils Relative % 66 %   Neutro Abs 4.6 1.7 - 7.7 K/uL   Lymphocytes Relative 14 %   Lymphs Abs 1.0 0.7 - 4.0 K/uL   Monocytes Relative 12 %   Monocytes Absolute 0.8 0 - 1 K/uL   Eosinophils Relative 6 %   Eosinophils Absolute 0.4 0 - 0 K/uL   Basophils Relative 1 %   Basophils Absolute 0.0 0 - 0 K/uL   Immature Granulocytes 1 %   Abs Immature Granulocytes 0.08 (H) 0.00 - 0.07 K/uL  Comprehensive metabolic panel     Status: Abnormal   Collection Time: 06/20/20  4:39 PM  Result Value Ref Range   Sodium 139 135 - 145 mmol/L   Potassium 3.9 3.5 - 5.1 mmol/L   Chloride 108 98 - 111 mmol/L   CO2 21 (L) 22 - 32 mmol/L   Glucose, Bld 75  70 - 99 mg/dL   BUN 12 6 - 20 mg/dL   Creatinine, Ser 59.7 0.44 - 1.00 mg/dL   Calcium 8.9 8.9 - 06/22/20 mg/dL   Total Protein 6.1 (L) 6.5 - 8.1 g/dL   Albumin 2.8 (L) 3.5 - 5.0 g/dL   AST 28 15 - 41 U/L   ALT 18 0 - 44 U/L   Alkaline Phosphatase 78 38 - 126 U/L   Total Bilirubin 0.5 0.3 - 1.2 mg/dL   GFR calc non Af Amer >60 >60 mL/min   GFR calc Af Amer >60 >60 mL/min   Anion gap 10 5 - 15  Protein / creatinine ratio, urine     Status: None   Collection Time: 06/20/20  5:00 PM  Result Value Ref Range   Creatinine, Urine 100 mg/dL   Total Protein, Urine 13 mg/dL   Protein Creatinine Ratio 0.13 0.00 - 0.15 mg/mg[Cre]    Plan:  38.4 May 06/22/20 was discharged to home in good condition. Follow-up appointment at [facility] on Monday  for follow up   Discharge Medications: Allergies as of 06/20/2020   No Known Allergies     Medication List    STOP taking these medications   acetaminophen 325 MG tablet Commonly known as: Tylenol   fexofenadine-pseudoephedrine 60-120 MG 12 hr tablet Commonly known as: ALLEGRA-D   ibuprofen 600 MG tablet Commonly known as: ADVIL   prenatal multivitamin Tabs tablet   ProAir HFA 108 (90 Base) MCG/ACT inhaler Generic drug: albuterol     TAKE these medications   furosemide 20 MG tablet Commonly known as: Lasix Take 1 tablet (20 mg total) by mouth daily.   labetalol 200 MG tablet Commonly known as: NORMODYNE Take 1 tablet (200 mg total) by mouth 2 (two) times daily.        Follow-up Information    [facility]. Schedule an appointment as soon as possible for a visit on 06/23/2020.   Contact information: [address] [place] [address] [phone]              Signed: Cyril Mourning, CNM 8:04 PM

## 2020-06-20 NOTE — OB Triage Note (Signed)
Patient here for Post partum PRE E evaluation. Patient has headache and plus 2 pitting edema.

## 2020-06-20 NOTE — Discharge Instructions (Signed)
Postpartum Hypertension Postpartum hypertension is high blood pressure that remains higher than normal after childbirth. You may not realize that you have postpartum hypertension if your blood pressure is not being checked regularly. In most cases, postpartum hypertension will go away on its own, usually within a week of delivery. However, for some women, medical treatment is required to prevent serious complications, such as seizures or stroke. What are the causes? This condition may be caused by one or more of the following:  Hypertension that existed before pregnancy (chronic hypertension).  Hypertension that comes on as a result of pregnancy (gestational hypertension).  Hypertensive disorders during pregnancy (preeclampsia) or seizures in women who have high blood pressure during pregnancy (eclampsia).  A condition in which the liver, platelets, and red blood cells are damaged during pregnancy (HELLP syndrome).  A condition in which the thyroid produces too much hormones (hyperthyroidism).  Other rare problems of the nerves (neurological disorders) or blood disorders. In some cases, the cause may not be known. What increases the risk? The following factors may make you more likely to develop this condition:  Chronic hypertension. In some cases, this may not have been diagnosed before pregnancy.  Obesity.  Type 2 diabetes.  Kidney disease.  History of preeclampsia or eclampsia.  Other medical conditions that change the level of hormones in the body (hormonal imbalance). What are the signs or symptoms? As with all types of hypertension, postpartum hypertension may not have any symptoms. Depending on how high your blood pressure is, you may experience:  Headaches. These may be mild, moderate, or severe. They may also be steady, constant, or sudden in onset (thunderclap headache).  Changes in your ability to see (visual changes).  Dizziness.  Shortness of breath.  Swelling  of your hands, feet, lower legs, or face. In some cases, you may have swelling in more than one of these locations.  Heart palpitations or a racing heartbeat.  Difficulty breathing while lying down.  Decrease in the amount of urine that you pass. Other rare signs and symptoms may include:  Sweating more than usual. This lasts longer than a few days after delivery.  Chest pain.  Sudden dizziness when you get up from sitting or lying down.  Seizures.  Nausea or vomiting.  Abdominal pain. How is this diagnosed? This condition may be diagnosed based on the results of a physical exam, blood pressure measurements, and blood and urine tests. You may also have other tests, such as a CT scan or an MRI, to check for other problems of postpartum hypertension. How is this treated? If blood pressure is high enough to require treatment, your options may include:  Medicines to reduce blood pressure (antihypertensives). Tell your health care provider if you are breastfeeding or if you plan to breastfeed. There are many antihypertensive medicines that are safe to take while breastfeeding.  Stopping medicines that may be causing hypertension.  Treating medical conditions that are causing hypertension.  Treating the complications of hypertension, such as seizures, stroke, or kidney problems. Your health care provider will also continue to monitor your blood pressure closely until it is within a safe range for you. Follow these instructions at home:  Take over-the-counter and prescription medicines only as told by your health care provider.  Return to your normal activities as told by your health care provider. Ask your health care provider what activities are safe for you.  Do not use any products that contain nicotine or tobacco, such as cigarettes and e-cigarettes. If   you need help quitting, ask your health care provider.  Keep all follow-up visits as told by your health care provider. This  is important. Contact a health care provider if:  Your symptoms get worse.  You have new symptoms, such as: ? A headache that does not get better. ? Dizziness. ? Visual changes. Get help right away if:  You suddenly develop swelling in your hands, ankles, or face.  You have sudden, rapid weight gain.  You develop difficulty breathing, chest pain, racing heartbeat, or heart palpitations.  You develop severe pain in your abdomen.  You have any symptoms of a stroke. "BE FAST" is an easy way to remember the main warning signs of a stroke: ? B - Balance. Signs are dizziness, sudden trouble walking, or loss of balance. ? E - Eyes. Signs are trouble seeing or a sudden change in vision. ? F - Face. Signs are sudden weakness or numbness of the face, or the face or eyelid drooping on one side. ? A - Arms. Signs are weakness or numbness in an arm. This happens suddenly and usually on one side of the body. ? S - Speech. Signs are sudden trouble speaking, slurred speech, or trouble understanding what people say. ? T - Time. Time to call emergency services. Write down what time symptoms started.  You have other signs of a stroke, such as: ? A sudden, severe headache with no known cause. ? Nausea or vomiting. ? Seizure. These symptoms may represent a serious problem that is an emergency. Do not wait to see if the symptoms will go away. Get medical help right away. Call your local emergency services (911 in the [place] Do not drive yourself to the hospital. Summary  Postpartum hypertension is high blood pressure that remains higher than normal after childbirth.  In most cases, postpartum hypertension will go away on its own, usually within a week of delivery.  For some women, medical treatment is required to prevent serious complications, such as seizures or stroke. This information is not intended to replace advice given to you by your health care provider. Make sure you discuss any questions  you have with your health care provider. Document Revised: 10/27/2018 Document Reviewed: 07/11/2017 Elsevier Patient Education  2020 [employer]  

## 2020-06-20 NOTE — Progress Notes (Signed)
Sent from the office for superimposed Pre-E in postpartum period

## 2021-06-13 ENCOUNTER — Other Ambulatory Visit: Payer: Self-pay

## 2021-06-13 ENCOUNTER — Emergency Department: Payer: Medicaid Other

## 2021-06-13 ENCOUNTER — Emergency Department
Admission: EM | Admit: 2021-06-13 | Discharge: 2021-06-13 | Disposition: A | Discharge status: Home / Self Care | Payer: Medicaid Other | Attending: Emergency Medicine | Admitting: Emergency Medicine

## 2021-06-13 DIAGNOSIS — J45909 Unspecified asthma, uncomplicated: Secondary | ICD-10-CM | POA: Insufficient documentation

## 2021-06-13 DIAGNOSIS — Z79899 Other long term (current) drug therapy: Secondary | ICD-10-CM | POA: Insufficient documentation

## 2021-06-13 DIAGNOSIS — I10 Essential (primary) hypertension: Secondary | ICD-10-CM | POA: Diagnosis not present

## 2021-06-13 DIAGNOSIS — Z87891 Personal history of nicotine dependence: Secondary | ICD-10-CM | POA: Insufficient documentation

## 2021-06-13 DIAGNOSIS — X58XXXA Exposure to other specified factors, initial encounter: Secondary | ICD-10-CM | POA: Diagnosis not present

## 2021-06-13 DIAGNOSIS — S39012A Strain of muscle, fascia and tendon of lower back, initial encounter: Secondary | ICD-10-CM | POA: Diagnosis not present

## 2021-06-13 DIAGNOSIS — S3992XA Unspecified injury of lower back, initial encounter: Secondary | ICD-10-CM | POA: Diagnosis present

## 2021-06-13 MED ORDER — OXYCODONE-ACETAMINOPHEN 7.5-325 MG PO TABS: 1.0000 | ORAL_TABLET | Freq: Four times a day (QID) | ORAL | 0 refills | Status: DC | PRN

## 2021-06-13 MED ORDER — ORPHENADRINE CITRATE ER 100 MG PO TB12: 100.0000 mg | ORAL_TABLET | Freq: Two times a day (BID) | ORAL | 0 refills | Status: DC

## 2021-06-13 MED ORDER — HYDROMORPHONE HCL 1 MG/ML IJ SOLN
1.0000 mg | Freq: Once | INTRAMUSCULAR | Status: AC
  Administered 2021-06-13: 1 mg via INTRAMUSCULAR
  Filled 2021-06-13: qty 1

## 2021-06-13 MED ORDER — NAPROXEN 500 MG PO TABS: 500.0000 mg | ORAL_TABLET | Freq: Two times a day (BID) | ORAL | Status: DC

## 2021-06-13 MED ORDER — ORPHENADRINE CITRATE 30 MG/ML IJ SOLN
60.0000 mg | Freq: Two times a day (BID) | INTRAMUSCULAR | Status: DC
  Administered 2021-06-13: 60 mg via INTRAMUSCULAR
  Filled 2021-06-13 (×3): qty 2

## 2021-06-13 NOTE — ED Triage Notes (Signed)
Pt arrives via pov with c/o lower right back pain starting 2-3 days ago. Pain increased last night. NAD noted at this time

## 2021-06-13 NOTE — Discharge Instructions (Signed)
No acute findings on x-ray of the lumbar spine.  Read and follow discharge care instruction.  Take medication as directed. 

## 2021-06-13 NOTE — ED Provider Notes (Signed)
[facility] Emergency Department Provider Note   ____________________________________________   Event Date/Time   First MD Initiated Contact with Patient 06/13/21 [phone]     (approximate)  I have reviewed the triage vital signs and the nursing notes.   HISTORY  Chief Complaint Back Pain    HPI Patricia Duarte is a 30 y.o. female patient presents with 3 days of increasing right low back pain.  Patient denies radicular component to her back pain.  Patient has bladder bowel dysfunction.  Patient said pain increases with lateral movements and flexion.  Patient rates pain as a 9/10.  Patient described pain as "achy/spasmatic".  No palliative measure for complaint.  Patient states she is breast-feeding.  Patient stated her child is 60 years old and that she can start breast-feeding if medication count indicates breast-feeding.  Patient blood pressure is also elevated.  Patient states history of hypertension but due to lack of insurance has not taken medication over a year.  Patient stated on her pregnancy she was taking labetalol.         Past Medical History:  Diagnosis Date   Asthma    Hypertension    Chronic    Patient Active Problem List   Diagnosis Date Noted   Elevated blood pressure reading 06/20/2020   Chronic hypertension affecting pregnancy 06/17/2020   Leakage of amniotic fluid 05/23/2020   Abdominal pain affecting pregnancy 05/05/2020   Hyperglycemia 03/17/2017   Hypertriglyceridemia 04/20/2016   Obesity, Class II, BMI 35-39.9 11/04/2015   Acute pain of left wrist 11/04/2015   Dermatitis 08/04/2015   Contraception management 08/04/2015   Allergic rhinitis 08/04/2015   Migraine headache without aura 05/02/2015   Nonintractable episodic headache 05/02/2015   Benign hypertension 05/02/2015    Past Surgical History:  Procedure Laterality Date   CHOLECYSTECTOMY  2008   TUBAL LIGATION N/A 06/18/2020   Procedure: POST PARTUM TUBAL  LIGATION;  Surgeon: Christeen Douglas, MD;  Location: [facility] ORS;  Service: Gynecology;  Laterality: N/A;    Prior to Admission medications   Medication Sig Start Date End Date Taking? Authorizing Provider  naproxen (NAPROSYN) 500 MG tablet Take 1 tablet (500 mg total) by mouth 2 (two) times daily with a meal. 06/13/21  Yes Joni Reining, PA-C  orphenadrine (NORFLEX) 100 MG tablet Take 1 tablet (100 mg total) by mouth 2 (two) times daily. 06/13/21  Yes Joni Reining, PA-C  oxyCODONE-acetaminophen (PERCOCET) 7.5-325 MG tablet Take 1 tablet by mouth every 6 (six) hours as needed for severe pain. 06/13/21  Yes Joni Reining, PA-C  furosemide (LASIX) 20 MG tablet Take 1 tablet (20 mg total) by mouth daily. 06/20/20 06/20/21  Haroldine Laws, CNM  labetalol (NORMODYNE) 200 MG tablet Take 1 tablet (200 mg total) by mouth 2 (two) times daily. 06/19/20   Gustavo Lah, CNM    Allergies Patient has no known allergies.  Family History  Problem Relation Age of Onset   Hypertension Mother    Anxiety disorder Mother    Hypertension Brother    Diabetes Maternal Grandmother    Hypertension Maternal Grandmother    Hyperlipidemia Maternal Grandmother    Diabetes Maternal Grandfather    Cancer Maternal Grandfather        bladder   Hypertension Maternal Grandfather    Cirrhosis Father    Drug abuse Father    Alcohol abuse Father    Heart attack Paternal Grandmother     Social History Social History  Tobacco Use   Smoking status: Former    Packs/day: 0.50    Types: Cigarettes    Quit date: 2021    Years since quitting: 1.6   Smokeless tobacco: Never  Substance Use Topics   Alcohol use: No    Alcohol/week: 0.0 standard drinks   Drug use: No    Review of Systems Constitutional: No fever/chills Eyes: No visual changes. ENT: No sore throat. Cardiovascular: Denies chest pain. Respiratory: Denies shortness of breath. Gastrointestinal: No abdominal pain.  No nausea, no vomiting.  No  diarrhea.  No constipation. Genitourinary: Negative for dysuria. Musculoskeletal: Left lateral back pain.   Skin: Negative for rash. Neurological: Negative for headaches, focal weakness or numbness. Endocrine: Hyperlipidemia and hypertension  ____________________________________________   PHYSICAL EXAM:  VITAL SIGNS: ED Triage Vitals  Enc Vitals Group     BP 06/13/21 0916 (!) 160/115     Pulse Rate 06/13/21 0916 89     Resp 06/13/21 0916 16     Temp 06/13/21 0916 98.2 F (36.8 C)     Temp Source 06/13/21 0916 Oral     SpO2 06/13/21 0916 100 %     Weight 06/13/21 0917 220 lb (99.8 kg)     Height 06/13/21 0917 5\' 7"  (1.702 m)     Head Circumference --      Peak Flow --      Pain Score 06/13/21 0921 9     Pain Loc --      Pain Edu? --      Excl. in GC? --     Constitutional: Alert and oriented. Well appearing and in no acute distress.  BMI is 34.46. Eyes: Conjunctivae are normal. PERRL. EOMI. Head: Atraumatic. Nose: No congestion/rhinnorhea. Mouth/Throat: Mucous membranes are moist.  Oropharynx non-erythematous. Neck: No stridor.  No cervical spine tenderness to palpation. Hematological/Lymphatic/Immunilogical: No cervical lymphadenopathy. Cardiovascular: Normal rate, regular rhythm. Grossly normal heart sounds.  Good peripheral circulation.  Elevated blood pressure. Respiratory: Normal respiratory effort.  No retractions. Lungs CTAB. Gastrointestinal: Soft and nontender. No distention. No abdominal bruits. No CVA tenderness. Genitourinary: Deferred Musculoskeletal: No obvious deformity at the lumbar spine.  Patient is moderate guarding palpation from L4-S1.  Patient had negative straight leg test in supine position.  No lower extremity tenderness nor edema.  No joint effusions. Neurologic:  Normal speech and language. No gross focal neurologic deficits are appreciated. No gait instability. Skin:  Skin is warm, dry and intact. No rash noted. Psychiatric: Mood and affect are  normal. Speech and behavior are normal.  ____________________________________________   LABS (all labs ordered are listed, but only abnormal results are displayed)  Labs Reviewed - No data to display ____________________________________________  EKG   ____________________________________________  RADIOLOGY I, 08/13/21, personally viewed and evaluated these images (plain radiographs) as part of my medical decision making, as well as reviewing the written report by the radiologist.  ED MD interpretation: No acute findings on x-ray of the lumbar spine.  Official radiology report(s): DG Lumbar Spine 2-3 Views  Result Date: 06/13/2021 CLINICAL DATA:  Low back pain for 3 days. EXAM: LUMBAR SPINE - 2-3 VIEW COMPARISON:  July 14, 2012 FINDINGS: There is no evidence of lumbar spine fracture. Alignment is normal. There is mild decreased intervertebral space at L5-S1. Prior cholecystectomy clips are identified in the right quadrant. IMPRESSION: Mild degenerative joint changes at L5-S1. Electronically Signed   By: July 16, 2012 M.D.   On: 06/13/2021 12:52    ____________________________________________   PROCEDURES  Procedure(s) performed (including Critical Care):  Procedures   ____________________________________________   INITIAL IMPRESSION / ASSESSMENT AND PLAN / ED COURSE  As part of my medical decision making, I reviewed the following data within the electronic MEDICAL RECORD NUMBER         Patient complain of several days of low back pain to the right lateral side of the lumbar spine.  Patient denies bladder or bowel dysfunction.  Discussed no acute findings on x-ray of the lumbar spine.  Patient complaining physical exam consistent with lumbar strain.  Patient given discharge care instruction advised take medication as directed.  Patient advised to reestablish care with PCP.      ____________________________________________   FINAL CLINICAL IMPRESSION(S) / ED  DIAGNOSES  Final diagnoses:  Strain of lumbar region, initial encounter     ED Discharge Orders          Ordered    oxyCODONE-acetaminophen (PERCOCET) 7.5-325 MG tablet  Every 6 hours PRN        06/13/21 1304    orphenadrine (NORFLEX) 100 MG tablet  2 times daily        06/13/21 1304    naproxen (NAPROSYN) 500 MG tablet  2 times daily with meals        06/13/21 1304             Note:  This document was prepared using Dragon voice recognition software and may include unintentional dictation errors.    Joni Reining, PA-C 06/13/21 1309    Delton Prairie, MD 06/13/21 [phone]

## 2022-03-08 ENCOUNTER — Emergency Department
Admission: EM | Admit: 2022-03-08 | Discharge: 2022-03-08 | Disposition: A | Discharge status: Home / Self Care | Payer: Medicaid Other | Attending: Emergency Medicine | Admitting: Emergency Medicine

## 2022-03-08 ENCOUNTER — Emergency Department: Payer: Medicaid Other

## 2022-03-08 ENCOUNTER — Other Ambulatory Visit: Payer: Self-pay

## 2022-03-08 DIAGNOSIS — J189 Pneumonia, unspecified organism: Secondary | ICD-10-CM

## 2022-03-08 DIAGNOSIS — J181 Lobar pneumonia, unspecified organism: Secondary | ICD-10-CM | POA: Diagnosis not present

## 2022-03-08 DIAGNOSIS — R0602 Shortness of breath: Secondary | ICD-10-CM | POA: Insufficient documentation

## 2022-03-08 DIAGNOSIS — Z20822 Contact with and (suspected) exposure to covid-19: Secondary | ICD-10-CM | POA: Insufficient documentation

## 2022-03-08 DIAGNOSIS — R059 Cough, unspecified: Secondary | ICD-10-CM | POA: Diagnosis present

## 2022-03-08 DIAGNOSIS — J45909 Unspecified asthma, uncomplicated: Secondary | ICD-10-CM | POA: Insufficient documentation

## 2022-03-08 LAB — GROUP A STREP BY PCR: Group A Strep by PCR: NOT DETECTED

## 2022-03-08 LAB — CBC WITH DIFFERENTIAL/PLATELET
Abs Immature Granulocytes: 0.04 10*3/uL (ref 0.00–0.07)
Basophils Absolute: 0 10*3/uL (ref 0.0–0.1)
Basophils Relative: 1 %
Eosinophils Absolute: 0.2 10*3/uL (ref 0.0–0.5)
Eosinophils Relative: 3 %
HCT: 40.9 % (ref 36.0–46.0)
Hemoglobin: 13.6 g/dL (ref 12.0–15.0)
Immature Granulocytes: 1 %
Lymphocytes Relative: 12 %
Lymphs Abs: 0.7 10*3/uL (ref 0.7–4.0)
MCH: 29.2 pg (ref 26.0–34.0)
MCHC: 33.3 g/dL (ref 30.0–36.0)
MCV: 88 fL (ref 80.0–100.0)
Monocytes Absolute: 0.5 10*3/uL (ref 0.1–1.0)
Monocytes Relative: 9 %
Neutro Abs: 4.1 10*3/uL (ref 1.7–7.7)
Neutrophils Relative %: 74 %
Platelets: 240 10*3/uL (ref 150–400)
RBC: 4.65 MIL/uL (ref 3.87–5.11)
RDW: 13.2 % (ref 11.5–15.5)
WBC: 5.5 10*3/uL (ref 4.0–10.5)
nRBC: 0 % (ref 0.0–0.2)

## 2022-03-08 LAB — RESP PANEL BY RT-PCR (FLU A&B, COVID) ARPGX2
Influenza A by PCR: NEGATIVE
Influenza B by PCR: NEGATIVE
SARS Coronavirus 2 by RT PCR: NEGATIVE

## 2022-03-08 LAB — COMPREHENSIVE METABOLIC PANEL
ALT: 32 U/L (ref 0–44)
AST: 27 U/L (ref 15–41)
Albumin: 4 g/dL (ref 3.5–5.0)
Alkaline Phosphatase: 57 U/L (ref 38–126)
Anion gap: 7 (ref 5–15)
BUN: 7 mg/dL (ref 6–20)
CO2: 23 mmol/L (ref 22–32)
Calcium: 8.8 mg/dL — ABNORMAL LOW (ref 8.9–10.3)
Chloride: 106 mmol/L (ref 98–111)
Creatinine, Ser: 0.7 mg/dL (ref 0.44–1.00)
GFR, Estimated: >60 mL/min (ref >60)
Glucose, Bld: 95 mg/dL (ref 70–99)
Potassium: 4 mmol/L (ref 3.5–5.1)
Sodium: 136 mmol/L (ref 135–145)
Total Bilirubin: 0.8 mg/dL (ref 0.3–1.2)
Total Protein: 7.7 g/dL (ref 6.5–8.1)

## 2022-03-08 LAB — TROPONIN I (HIGH SENSITIVITY): Troponin I (High Sensitivity): 4 ng/L (ref <18)

## 2022-03-08 MED ORDER — AMOXICILLIN-POT CLAVULANATE 875-125 MG PO TABS: 1.0000 | ORAL_TABLET | Freq: Two times a day (BID) | ORAL | 0 refills | Status: AC

## 2022-03-08 MED ORDER — SODIUM CHLORIDE 0.9 % IV BOLUS
1000.0000 mL | Freq: Once | INTRAVENOUS | Status: AC
  Administered 2022-03-08: 1000 mL via INTRAVENOUS

## 2022-03-08 MED ORDER — SODIUM CHLORIDE 0.9 % IV SOLN
2.0000 g | Freq: Once | INTRAVENOUS | Status: AC
  Administered 2022-03-08: 2 g via INTRAVENOUS
  Filled 2022-03-08: qty 20

## 2022-03-08 MED ORDER — DOXYCYCLINE MONOHYDRATE 100 MG PO TABS: 100.0000 mg | ORAL_TABLET | Freq: Two times a day (BID) | ORAL | 0 refills | Status: AC

## 2022-03-08 MED ORDER — IOHEXOL 350 MG/ML SOLN
50.0000 mL | Freq: Once | INTRAVENOUS | Status: AC | PRN
  Administered 2022-03-08: 50 mL via INTRAVENOUS

## 2022-03-08 MED ORDER — KETOROLAC TROMETHAMINE 15 MG/ML IJ SOLN
15.0000 mg | Freq: Once | INTRAMUSCULAR | Status: AC
Start: 2022-03-08 — End: 2022-03-08
  Administered 2022-03-08: 15 mg via INTRAVENOUS
  Filled 2022-03-08: qty 1

## 2022-03-08 MED ORDER — ACETAMINOPHEN 500 MG PO TABS
1000.0000 mg | ORAL_TABLET | Freq: Once | ORAL | Status: AC
  Administered 2022-03-08: 1000 mg via ORAL
  Filled 2022-03-08: qty 2

## 2022-03-08 NOTE — ED Provider Notes (Signed)
[facility] Provider Note    Event Date/Time   First MD Initiated Contact with Patient 03/08/22 1130     (approximate)   History   Cough   HPI  Patricia Duarte is a 31 y.o. female who comes in with cough for 4 days. There is occasional green sputum and reports some discomfort with coughing. Reports associated Sore throat as well. No fevers. Has not taking any medications. Does report some fatigue.  Reports asthma history, seasonal allergies. Her son had a fever a few days ago but never was seen for it. Got covid vaccines.    Physical Exam   Triage Vital Signs: ED Triage Vitals  Enc Vitals Group     BP 03/08/22 1125 (!) 157/115     Pulse Rate 03/08/22 1125 100     Resp 03/08/22 1125 20     Temp 03/08/22 1125 98.7 F (37.1 C)     Temp src --      SpO2 03/08/22 1125 100 %     Weight 03/08/22 1138 220 lb 0.3 oz (99.8 kg)     Height 03/08/22 1138 5\' 7"  (1.702 m)     Head Circumference --      Peak Flow --      Pain Score 03/08/22 1123 4     Pain Loc --      Pain Edu? --      Excl. in GC? --     Most recent vital signs: Vitals:   03/08/22 1125  BP: (!) 157/115  Pulse: 100  Resp: 20  Temp: 98.7 F (37.1 C)  SpO2: 100%     General: Awake, no distress.  CV:  Good peripheral perfusion.  Resp:  Normal effort. Clear lungs no wheezing. Abd:  No distention.  Other:  OP red uvula midline, no exudates, full ROM of neck    ED Results / Procedures / Treatments   Labs (all labs ordered are listed, but only abnormal results are displayed) Labs Reviewed  GROUP A STREP BY PCR  RESP PANEL BY RT-PCR (FLU A&B, COVID) ARPGX2  POC URINE PREG, ED     EKG  My interpretation of EKG:  Sinus tachycardia rate of 114 without any ST elevation or T wave inversions, normal intervals  RADIOLOGY I have reviewed the xray personally and interpreted and there is concern for some right middle lobe and lower lobe pneumonia   PROCEDURES:  Critical  Care performed: No  Procedures   MEDICATIONS ORDERED IN ED: Medications  acetaminophen (TYLENOL) tablet 1,000 mg (1,000 mg Oral Given 03/08/22 1244)  sodium chloride 0.9 % bolus 1,000 mL (1,000 mLs Intravenous New Bag/Given 03/08/22 1244)  ketorolac (TORADOL) 15 MG/ML injection 15 mg (15 mg Intravenous Given 03/08/22 1401)  cefTRIAXone (ROCEPHIN) 2 g in sodium chloride 0.9 % 100 mL IVPB (0 g Intravenous Stopped 03/08/22 1438)  iohexol (OMNIPAQUE) 350 MG/ML injection 50 mL (50 mLs Intravenous Contrast Given 03/08/22 1353)     IMPRESSION / MDM / ASSESSMENT AND PLAN / ED COURSE  I reviewed the triage vital signs and the nursing notes.   Patient's presentation is most consistent with acute presentation with potential threat to life or bodily function.   Patient significant tachycardia I have placed a liter of fluid as well as lab work.  She denies any risk factors for PE and D-dimer most likely elevated due to concern for possible infection however she does report her father having a blood clot when he  was in his 30s therefore given her continued tachycardia with ambulation will get CT PE to rule out pulmonary embolism.  Her troponin is negative her CBC is reassuring.  Her CMP is reassuring COVID, flu is negative, strep negative.  Given patient is prediabetic and hypertensive patient started on Augmentin and doxycycline to cover for pneumonia and she understands that she should return to the ER if she develops worsening shortness of breath.  Amatory sat will be done prior to discharge and if reassuring patient can be discharged home  IMPRESSION: 1. No filling defect is identified in the pulmonary arterial tree to suggest pulmonary embolus. 2. Consolidation in portions of the right middle lobe and right lower lobe favoring multilobar pneumonia. Reactive right hilar adenopathy. 3. Low-density liver raises the possibility of diffuse hepatic steatosis.    Patient provided copy reports that LFTs were  normal  The patient is on the cardiac monitor to evaluate for evidence of arrhythmia and/or significant heart rate changes.      FINAL CLINICAL IMPRESSION(S) / ED DIAGNOSES   Final diagnoses:  Community acquired pneumonia of right lung, unspecified part of lung     Rx / DC Orders   ED Discharge Orders          Ordered    doxycycline (ADOXA) 100 MG tablet  2 times daily        03/08/22 1453    amoxicillin-clavulanate (AUGMENTIN) 875-125 MG tablet  2 times daily        03/08/22 1453             Note:  This document was prepared using Dragon voice recognition software and may include unintentional dictation errors.   Concha Se, MD 03/08/22 [phone]

## 2022-03-08 NOTE — ED Provider Triage Note (Signed)
Emergency Medicine Provider Triage Evaluation Note  Patricia Duarte , a 31 y.o. female  was evaluated in triage.  Pt complains of sore throat, cough for 4 days, no fever.  Review of Systems  Positive: Sore throat Negative: Fever chills  Physical Exam  There were no vitals taken for this visit. Gen:   Awake, no distress   Resp:  Normal effort  MSK:   Moves extremities without difficulty  Other:  Throat is bright red and swollen  Medical Decision Making  Medically screening exam initiated at 11:25 AM.  Appropriate orders placed.  Patricia Duarte was informed that the remainder of the evaluation will be completed by another provider, this initial triage assessment does not replace that evaluation, and the importance of remaining in the ED until their evaluation is complete.     Patricia Starks, PA-C 03/08/22 1125

## 2022-03-08 NOTE — ED Triage Notes (Signed)
Pt comes with c/o cough for 4 days. Pt states productive cough. Pt state sore throat as well.

## 2022-03-08 NOTE — ED Notes (Signed)
Pt ambulated sats stayed at 93% pt states she does not feel SOB. Pt back in bed with sats at 98%.

## 2022-03-08 NOTE — Discharge Instructions (Addendum)
You can take Tylenol 1 g every 8 hours and ibuprofen 600 every 6-8 hours with food to help with any pain or fevers.  Take both the antibiotics to help with infection and return to the ER if you develop worsening shortness of breath or any other concern   IMPRESSION:  1. No filling defect is identified in the pulmonary arterial tree to  suggest pulmonary embolus.  2. Consolidation in portions of the right middle lobe and right  lower lobe favoring multilobar pneumonia. Reactive right hilar  adenopathy.  3. Low-density liver raises the possibility of diffuse hepatic  steatosis.

## 2022-09-10 IMAGING — CR DG CHEST 2V
2 series · 2 of 2 positions shown · non-contrast
Comparison: 01/14/2013

CLINICAL DATA: Shortness of breath, productive cough and sore
throat over the last 4 days.

EXAM:
CHEST - 2 VIEW

[chest pa]
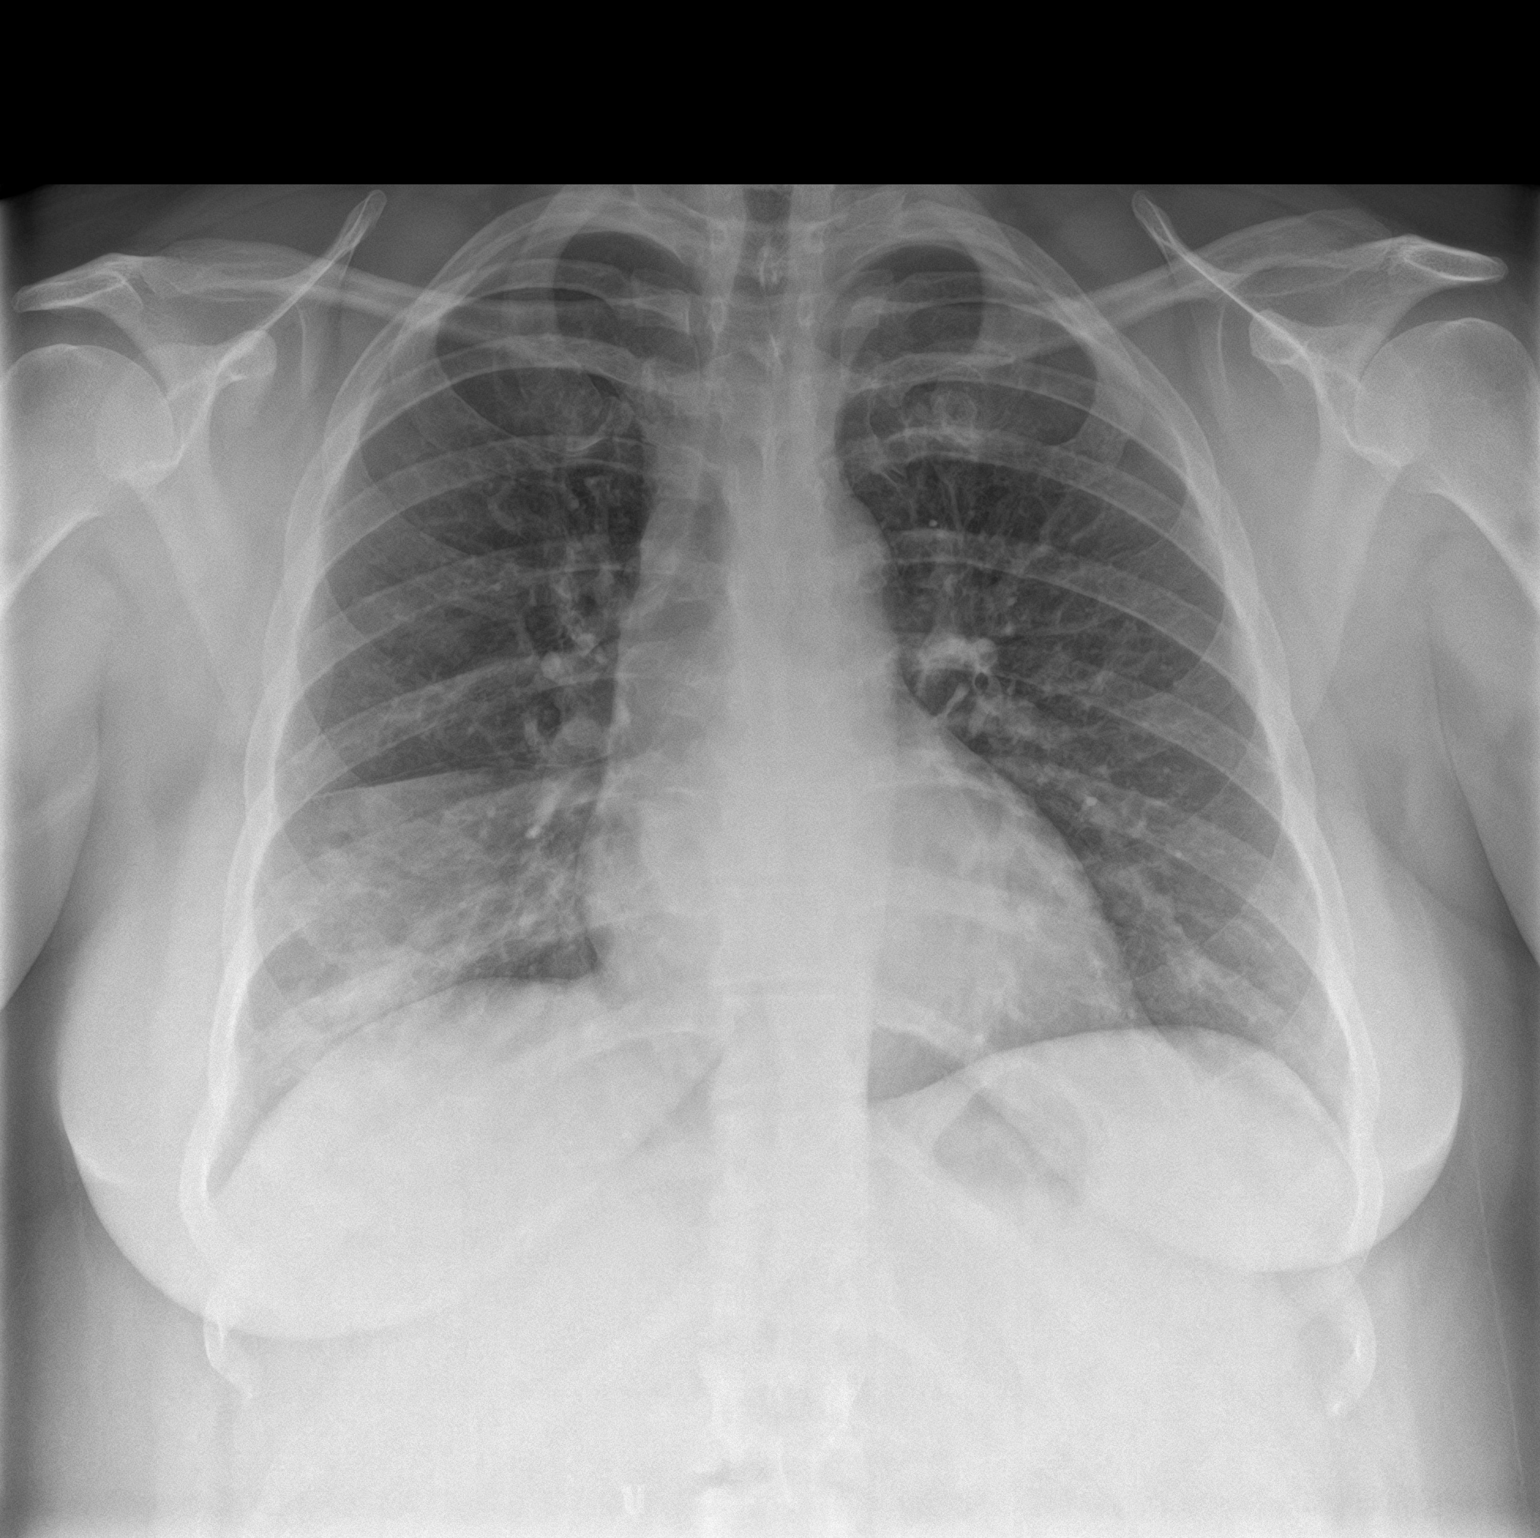

[chest lat]
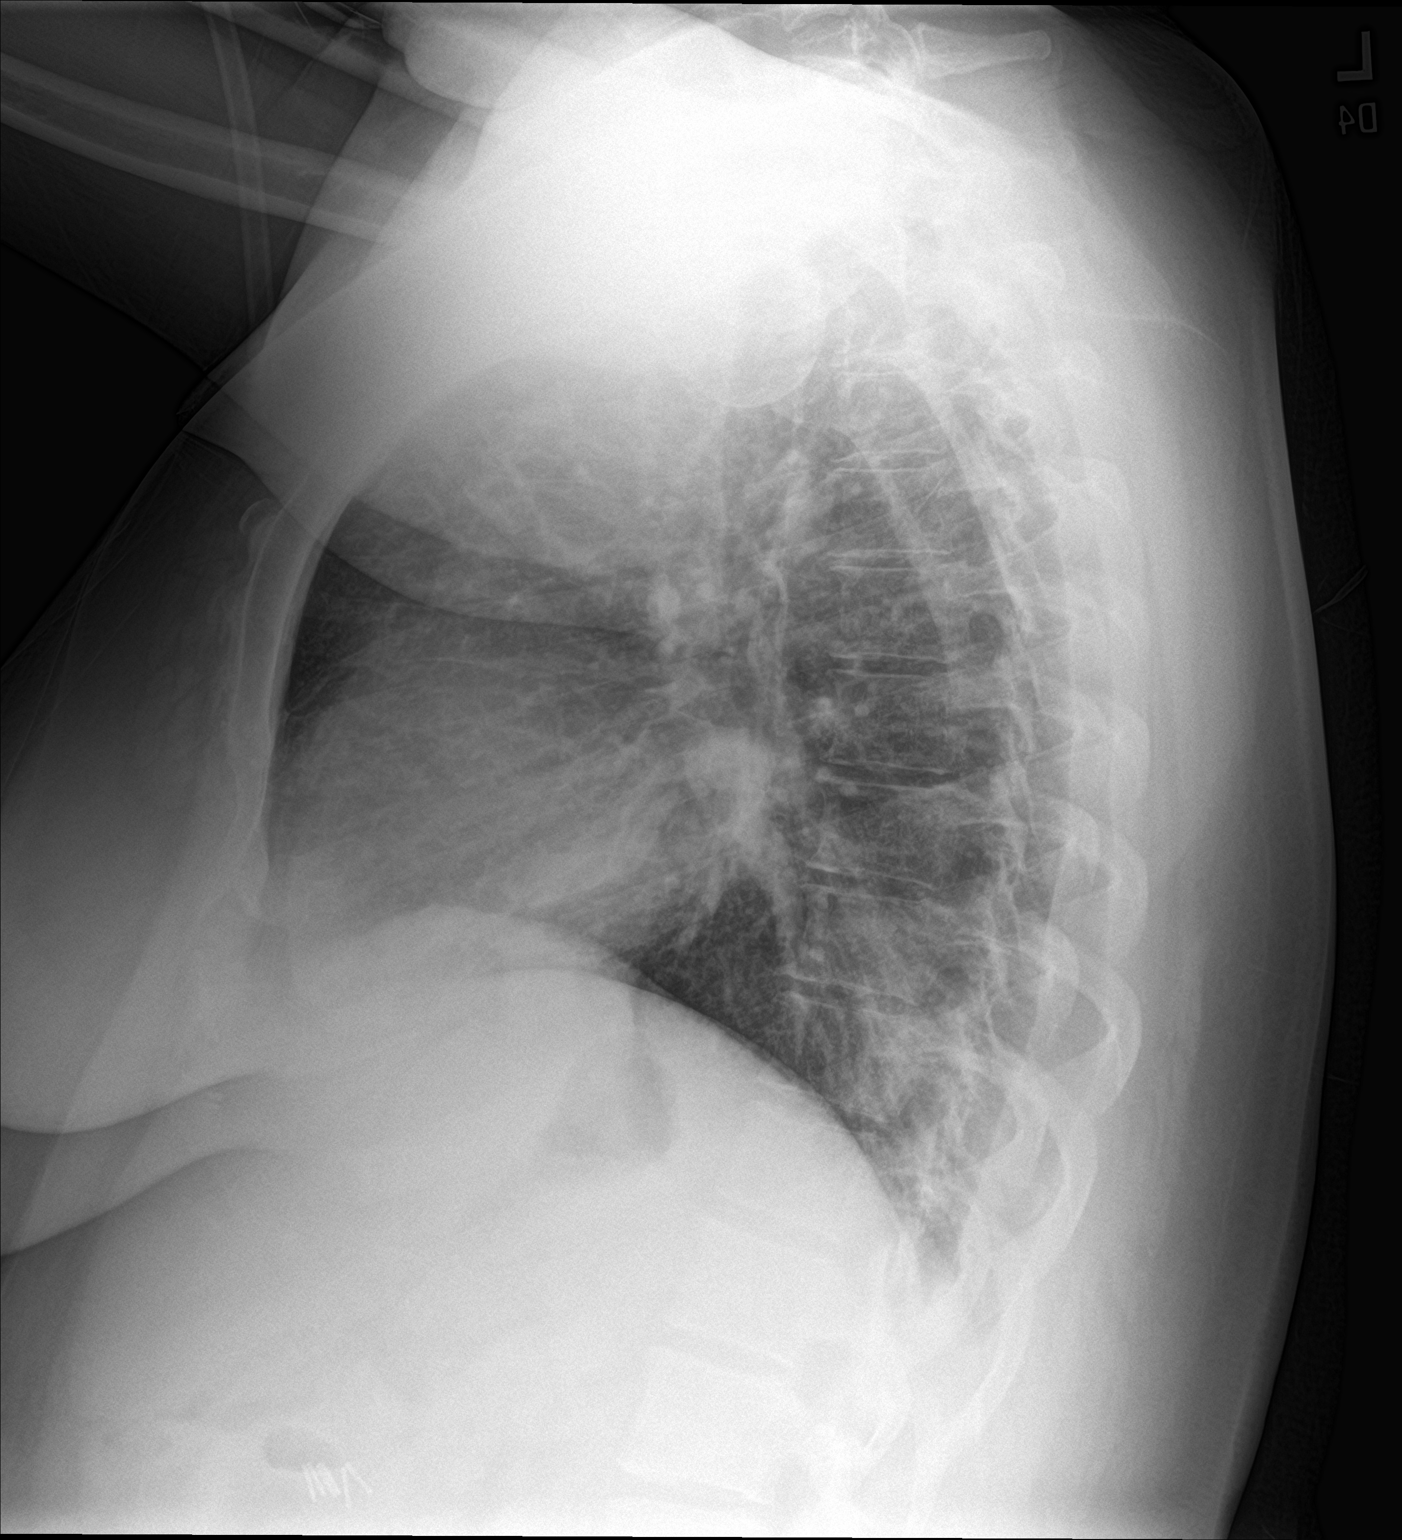

[2 of 2 positions shown; findings below may reference images not displayed]

FINDINGS: Heart size is normal. Mediastinal shadows are normal. There is right
lower lobe and right middle lobe bronchopneumonia. Mild patchy
infiltrate also in the left lower lobe. No lobar collapse. No
effusion. No abnormal bone finding.
IMPRESSION: Patchy bilateral lower lung pneumonia, most severe in the right
lower lobe and right middle lobe.

## 2022-09-10 IMAGING — CT CT ANGIO CHEST
3 of 13 series · 18 of 46 positions shown · IV contrast (APPLIED)
Comparison: Chest radiograph 03/08/2022

CLINICAL DATA: Shortness of breath. Productive cough. Sore throat.

EXAM:
CT ANGIOGRAPHY CHEST WITH CONTRAST
TECHNIQUE: Multidetector CT imaging of the chest was performed using the
standard protocol during bolus administration of intravenous
contrast. Multiplanar CT image reconstructions and MIPs were
obtained to evaluate the vascular anatomy.

[Series 6: thins · axial · 0.68mm/px · z∈[+1213,+1429]mm · 8 of 396 slices shown (1 of 2)]
[im 44/396  lung]
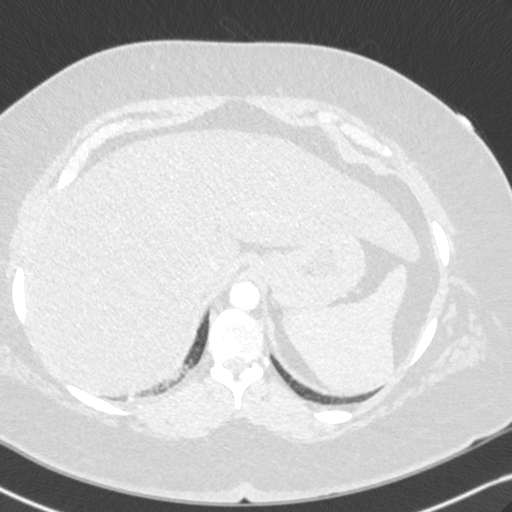
[im 88/396  lung]
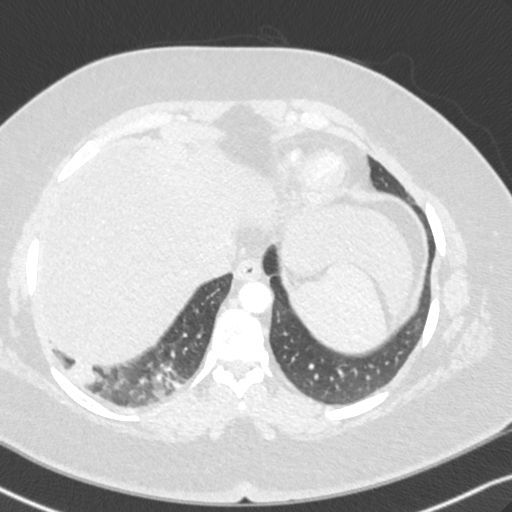
[im 132/396  lung]
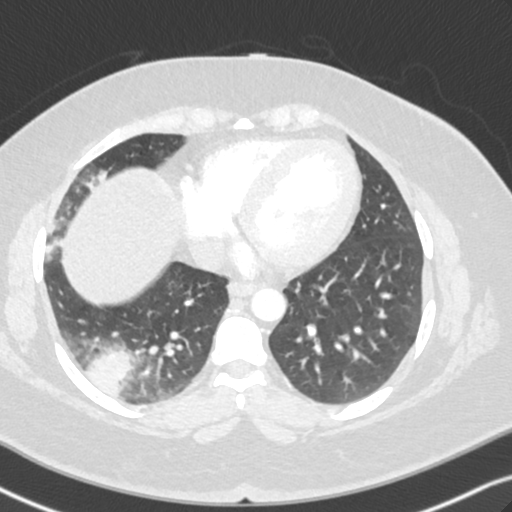
[im 176/396  lung]
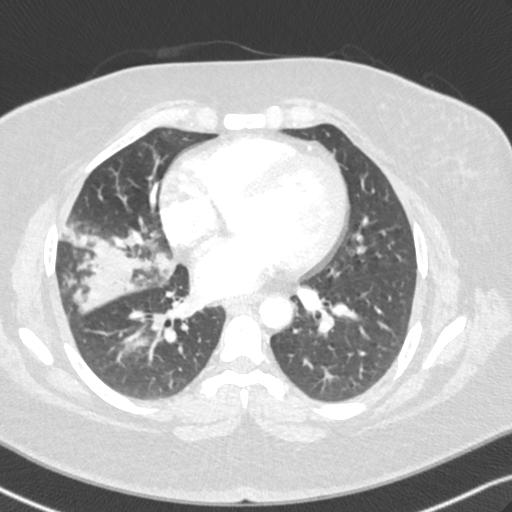
[im 220/396  lung]
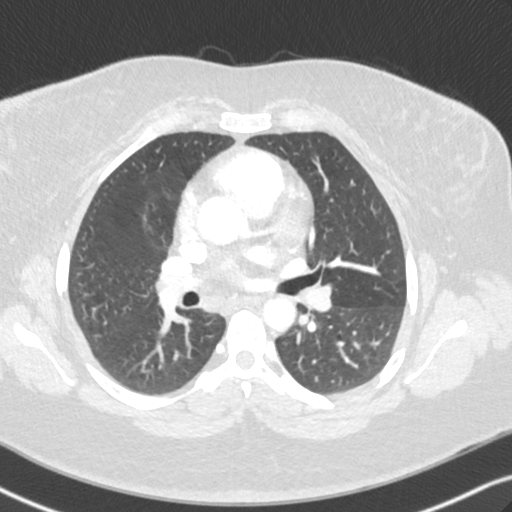
[im 264/396  lung]
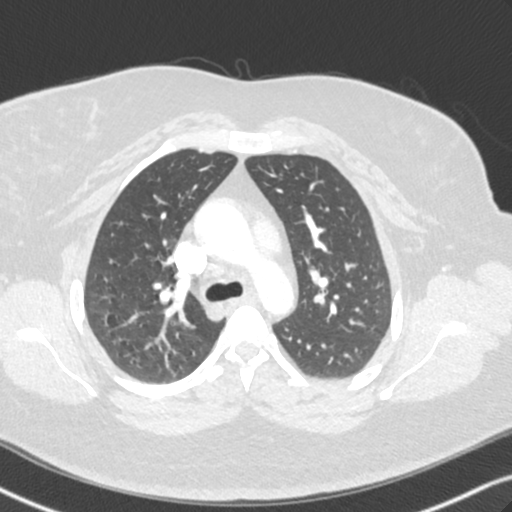
[im 308/396  lung]
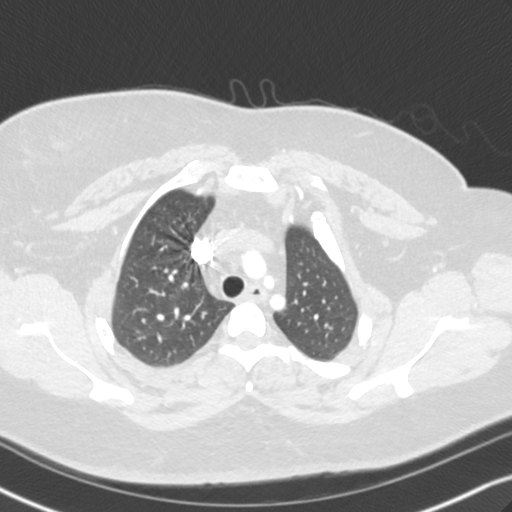
[im 352/396  lung]
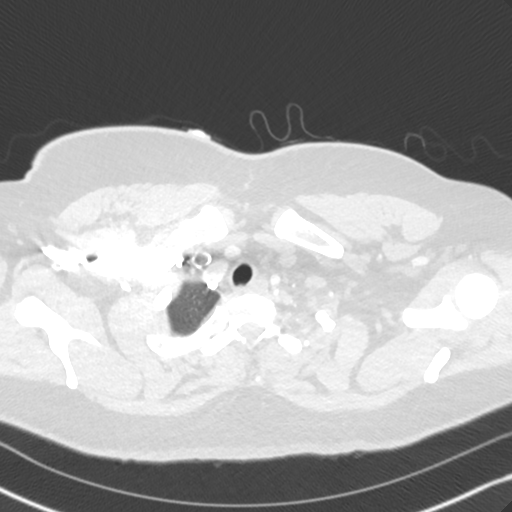

[Series 16: thins · axial · 0.62mm/px · z∈[+1251,+1486]mm · 9 of 421 slices shown (2 of 2)]
[im 43/421  lung]
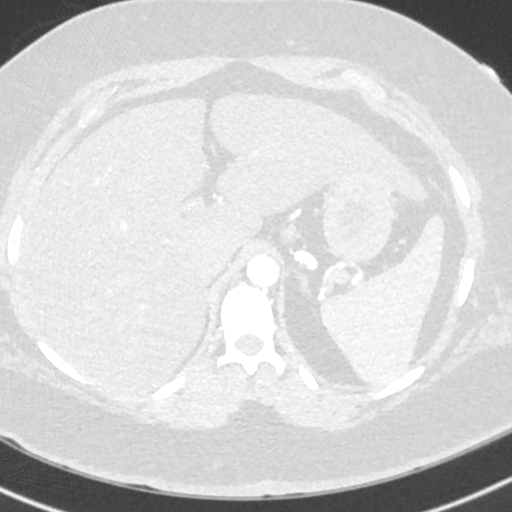
[im 85/421  soft-tissue]
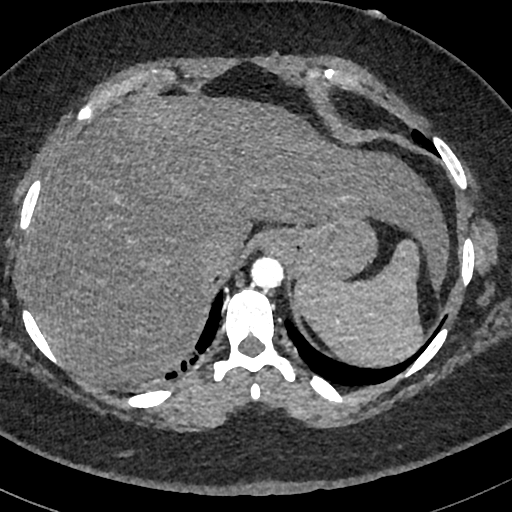
[im 127/421  lung]
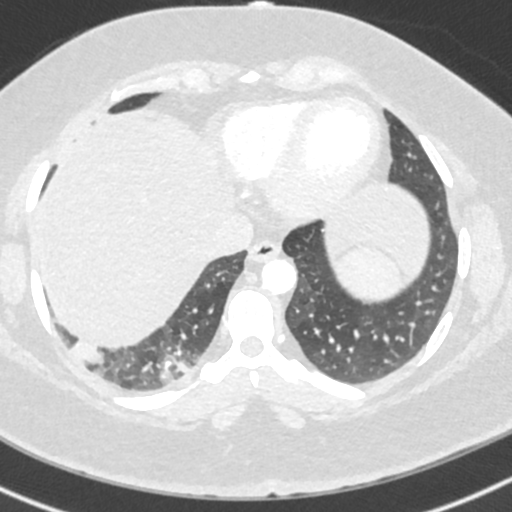
[im 169/421  soft-tissue]
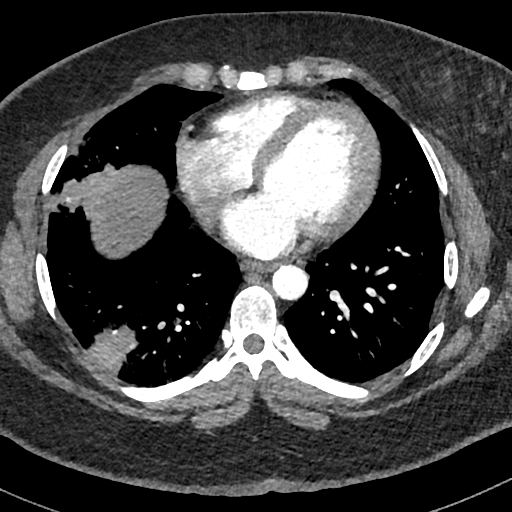
[im 211/421  lung]
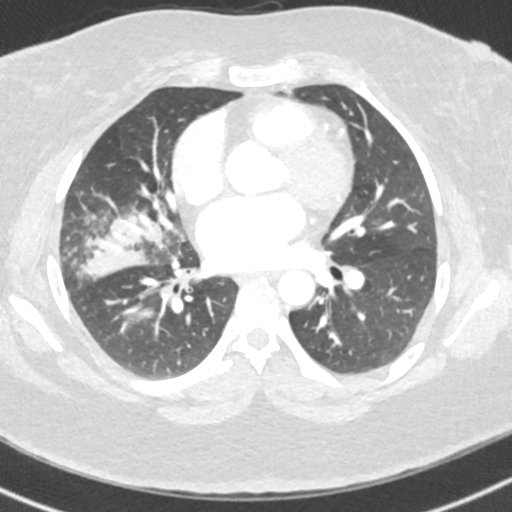
[im 253/421  soft-tissue]
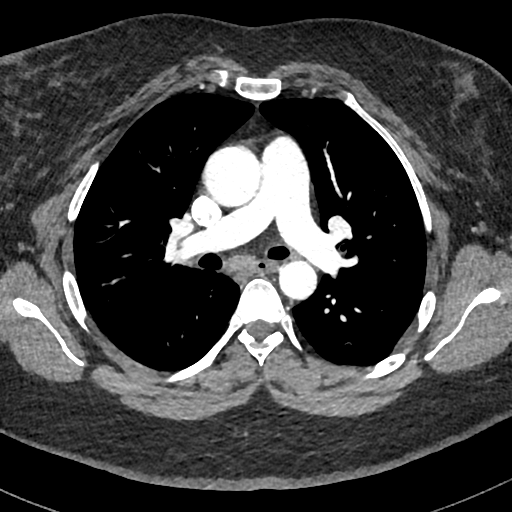
[im 295/421  lung]
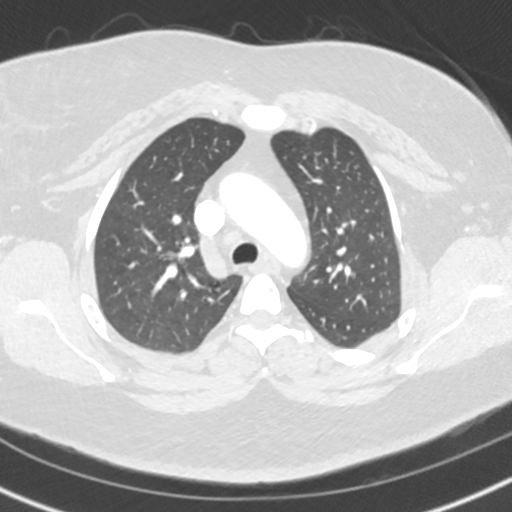
[im 337/421  soft-tissue]
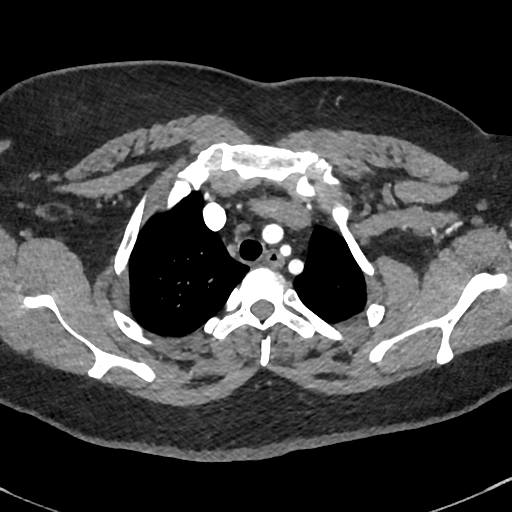
[im 379/421  lung]
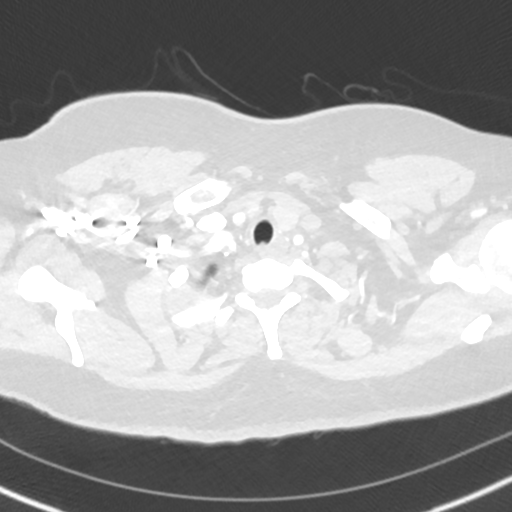

[Series 17: cor · coronal · 0.63mm/px · 1 of 157 slices shown]
[im 79/157  soft-tissue]
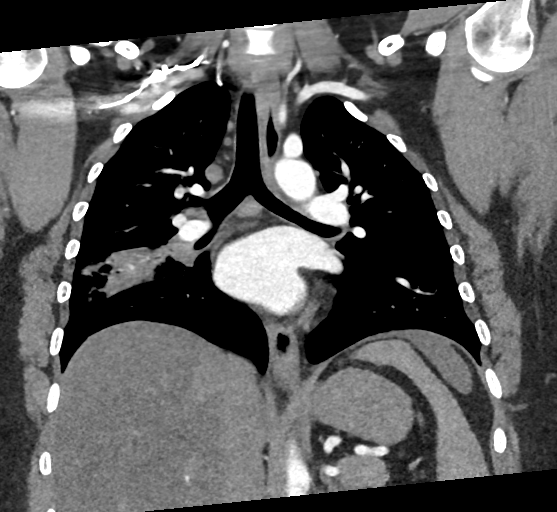

[18 of 46 positions shown; findings below may reference images not displayed]

RADIATION DOSE REDUCTION: This exam was performed according to the
departmental dose-optimization program which includes automated
exposure control, adjustment of the mA and/or kV according to
patient size and/or use of iterative reconstruction technique.

CONTRAST:  50mL OMNIPAQUE IOHEXOL 350 MG/ML SOLN
FINDINGS: Cardiovascular: Suboptimal initial contrast bolus, have the patient
return for a second injection. Today's exam report reflects careful
review of both sets of images.

No filling defect is identified in the pulmonary arterial tree to
suggest pulmonary embolus.

Mediastinum/Nodes: Right hilar node 1.3 cm in short axis, image 154
series 6. Subcarinal node 1.1 cm in short axis, image 56 series 4.

Lungs/Pleura: Consolidation/airspace opacity posteriorly in the
right middle lobe (image 72, series 5) and also posteriorly in the
right lower lobe (image 92, series 5).

Upper Abdomen: Low-density liver raising the possibility of diffuse
hepatic steatosis.

Musculoskeletal: Unremarkable

Review of the MIP images confirms the above findings.
IMPRESSION: 1. No filling defect is identified in the pulmonary arterial tree to
suggest pulmonary embolus.
2. Consolidation in portions of the right middle lobe and right
lower lobe favoring multilobar pneumonia. Reactive right hilar
adenopathy.
3. Low-density liver raises the possibility of diffuse hepatic
steatosis.

## 2023-12-12 ENCOUNTER — Emergency Department
Admission: EM | Admit: 2023-12-12 | Discharge: 2023-12-12 | Disposition: A | Discharge status: Home / Self Care | Attending: Emergency Medicine | Admitting: Emergency Medicine

## 2023-12-12 ENCOUNTER — Other Ambulatory Visit: Payer: Self-pay

## 2023-12-12 DIAGNOSIS — Y92410 Unspecified street and highway as the place of occurrence of the external cause: Secondary | ICD-10-CM | POA: Diagnosis not present

## 2023-12-12 DIAGNOSIS — S161XXA Strain of muscle, fascia and tendon at neck level, initial encounter: Secondary | ICD-10-CM | POA: Insufficient documentation

## 2023-12-12 DIAGNOSIS — M545 Low back pain, unspecified: Secondary | ICD-10-CM | POA: Insufficient documentation

## 2023-12-12 DIAGNOSIS — M542 Cervicalgia: Secondary | ICD-10-CM | POA: Diagnosis present

## 2023-12-12 MED ORDER — NAPROXEN 500 MG PO TABS: 500.0000 mg | ORAL_TABLET | Freq: Two times a day (BID) | ORAL | 2 refills | Status: AC

## 2023-12-12 MED ORDER — KETOROLAC TROMETHAMINE 30 MG/ML IJ SOLN
30.0000 mg | Freq: Once | INTRAMUSCULAR | Status: AC
  Administered 2023-12-12: 30 mg via INTRAMUSCULAR
  Filled 2023-12-12: qty 1
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
## 2023-12-12 NOTE — ED Notes (Signed)
 EDP, Kinner at bedside.
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
## 2023-12-14 ENCOUNTER — Other Ambulatory Visit: Payer: Self-pay

## 2023-12-14 ENCOUNTER — Encounter: Payer: Self-pay | Admitting: Emergency Medicine

## 2023-12-14 ENCOUNTER — Ambulatory Visit
Admission: RE | Admit: 2023-12-14 | Discharge: 2023-12-14 | Disposition: A | Discharge status: Home / Self Care | Source: Ambulatory Visit

## 2023-12-14 ENCOUNTER — Emergency Department
Admission: EM | Admit: 2023-12-14 | Discharge: 2023-12-15 | Disposition: A | Discharge status: Home / Self Care | Attending: Emergency Medicine | Admitting: Emergency Medicine

## 2023-12-14 VITALS — BP 143/83 | HR 93 | Temp 98.6°F | Resp 18

## 2023-12-14 DIAGNOSIS — N83202 Unspecified ovarian cyst, left side: Secondary | ICD-10-CM | POA: Diagnosis not present

## 2023-12-14 DIAGNOSIS — M542 Cervicalgia: Secondary | ICD-10-CM

## 2023-12-14 DIAGNOSIS — M25511 Pain in right shoulder: Secondary | ICD-10-CM | POA: Diagnosis not present

## 2023-12-14 DIAGNOSIS — R1013 Epigastric pain: Secondary | ICD-10-CM

## 2023-12-14 DIAGNOSIS — M5489 Other dorsalgia: Secondary | ICD-10-CM | POA: Diagnosis not present

## 2023-12-14 DIAGNOSIS — R1010 Upper abdominal pain, unspecified: Secondary | ICD-10-CM

## 2023-12-14 DIAGNOSIS — Y92410 Unspecified street and highway as the place of occurrence of the external cause: Secondary | ICD-10-CM | POA: Insufficient documentation

## 2023-12-14 DIAGNOSIS — M25512 Pain in left shoulder: Secondary | ICD-10-CM

## 2023-12-14 LAB — URINALYSIS, ROUTINE W REFLEX MICROSCOPIC
Bacteria, UA: NONE SEEN
Bilirubin Urine: NEGATIVE
Glucose, UA: NEGATIVE mg/dL
Ketones, ur: NEGATIVE mg/dL
Leukocytes,Ua: NEGATIVE
Nitrite: NEGATIVE
Protein, ur: NEGATIVE mg/dL
Specific Gravity, Urine: 1.023 (ref 1.005–1.030)
pH: 5 (ref 5.0–8.0)

## 2023-12-14 LAB — CBC
HCT: 40.6 % (ref 36.0–46.0)
Hemoglobin: 14 g/dL (ref 12.0–15.0)
MCH: 30.8 pg (ref 26.0–34.0)
MCHC: 34.5 g/dL (ref 30.0–36.0)
MCV: 89.2 fL (ref 80.0–100.0)
Platelets: 363 10*3/uL (ref 150–400)
RBC: 4.55 MIL/uL (ref 3.87–5.11)
RDW: 12.6 % (ref 11.5–15.5)
WBC: 7.7 10*3/uL (ref 4.0–10.5)
nRBC: 0 % (ref 0.0–0.2)

## 2023-12-14 LAB — POCT RAPID STREP A (OFFICE): Rapid Strep A Screen: NEGATIVE

## 2023-12-14 LAB — COMPREHENSIVE METABOLIC PANEL
ALT: 44 U/L (ref 0–44)
AST: 38 U/L (ref 15–41)
Albumin: 4.6 g/dL (ref 3.5–5.0)
Alkaline Phosphatase: 56 U/L (ref 38–126)
Anion gap: 10 (ref 5–15)
BUN: 13 mg/dL (ref 6–20)
CO2: 23 mmol/L (ref 22–32)
Calcium: 9.8 mg/dL (ref 8.9–10.3)
Chloride: 104 mmol/L (ref 98–111)
Creatinine, Ser: 0.61 mg/dL (ref 0.44–1.00)
GFR, Estimated: >60 mL/min (ref >60)
Glucose, Bld: 94 mg/dL (ref 70–99)
Potassium: 3.7 mmol/L (ref 3.5–5.1)
Sodium: 137 mmol/L (ref 135–145)
Total Bilirubin: 0.8 mg/dL (ref 0.0–1.2)
Total Protein: 8 g/dL (ref 6.5–8.1)

## 2023-12-14 LAB — LIPASE, BLOOD: Lipase: 26 U/L (ref 11–51)

## 2023-12-14 LAB — POC URINE PREG, ED: Preg Test, Ur: NEGATIVE
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
## 2023-12-15 ENCOUNTER — Emergency Department

## 2023-12-15 MED ORDER — IOHEXOL 350 MG/ML SOLN
100.0000 mL | Freq: Once | INTRAVENOUS | Status: AC | PRN
  Administered 2023-12-15: 100 mL via INTRAVENOUS
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
## 2023-12-15 NOTE — ED Notes (Signed)
 Pt CAOx4, breathing normally, and normal in color. Pt relaxing at this time.

## 2024-10-24 ENCOUNTER — Other Ambulatory Visit: Payer: Self-pay

## 2024-10-24 ENCOUNTER — Emergency Department
Admission: EM | Admit: 2024-10-24 | Discharge: 2024-10-24 | Disposition: A | Discharge status: Home / Self Care | Attending: Emergency Medicine | Admitting: Emergency Medicine

## 2024-10-24 ENCOUNTER — Emergency Department

## 2024-10-24 DIAGNOSIS — J45909 Unspecified asthma, uncomplicated: Secondary | ICD-10-CM | POA: Diagnosis not present

## 2024-10-24 DIAGNOSIS — G43909 Migraine, unspecified, not intractable, without status migrainosus: Secondary | ICD-10-CM | POA: Diagnosis not present

## 2024-10-24 DIAGNOSIS — I10 Essential (primary) hypertension: Secondary | ICD-10-CM | POA: Diagnosis not present

## 2024-10-24 DIAGNOSIS — G43001 Migraine without aura, not intractable, with status migrainosus: Secondary | ICD-10-CM | POA: Diagnosis present

## 2024-10-24 DIAGNOSIS — G43901 Migraine, unspecified, not intractable, with status migrainosus: Secondary | ICD-10-CM

## 2024-10-24 LAB — CBC WITH DIFFERENTIAL/PLATELET
Abs Immature Granulocytes: 0.06 K/uL (ref 0.00–0.07)
Basophils Absolute: 0.1 K/uL (ref 0.0–0.1)
Basophils Relative: 1 %
Eosinophils Absolute: 0.2 K/uL (ref 0.0–0.5)
Eosinophils Relative: 2 %
HCT: 42.6 % (ref 36.0–46.0)
Hemoglobin: 14.4 g/dL (ref 12.0–15.0)
Immature Granulocytes: 1 %
Lymphocytes Relative: 16 %
Lymphs Abs: 1.4 K/uL (ref 0.7–4.0)
MCH: 29.6 pg (ref 26.0–34.0)
MCHC: 33.8 g/dL (ref 30.0–36.0)
MCV: 87.7 fL (ref 80.0–100.0)
Monocytes Absolute: 0.5 K/uL (ref 0.1–1.0)
Monocytes Relative: 5 %
Neutro Abs: 6.9 K/uL (ref 1.7–7.7)
Neutrophils Relative %: 75 %
Platelets: 286 K/uL (ref 150–400)
RBC: 4.86 MIL/uL (ref 3.87–5.11)
RDW: 13.2 % (ref 11.5–15.5)
WBC: 9 K/uL (ref 4.0–10.5)
nRBC: 0 % (ref 0.0–0.2)

## 2024-10-24 LAB — BASIC METABOLIC PANEL WITH GFR
Anion gap: 10 (ref 5–15)
BUN: 8 mg/dL (ref 6–20)
CO2: 23 mmol/L (ref 22–32)
Calcium: 9.4 mg/dL (ref 8.9–10.3)
Chloride: 103 mmol/L (ref 98–111)
Creatinine, Ser: 0.59 mg/dL (ref 0.44–1.00)
GFR, Estimated: >60 mL/min
Glucose, Bld: 125 mg/dL — ABNORMAL HIGH (ref 70–99)
Potassium: 4 mmol/L (ref 3.5–5.1)
Sodium: 136 mmol/L (ref 135–145)

## 2024-10-24 LAB — MAGNESIUM: Magnesium: 2 mg/dL (ref 1.7–2.4)

## 2024-10-24 LAB — POC URINE PREG, ED: Preg Test, Ur: NEGATIVE

## 2024-10-24 MED ORDER — SODIUM CHLORIDE 0.9 % IV BOLUS
1000.0000 mL | Freq: Once | INTRAVENOUS | Status: AC
  Administered 2024-10-24: 1000 mL via INTRAVENOUS

## 2024-10-24 MED ORDER — DIPHENHYDRAMINE HCL 50 MG/ML IJ SOLN
25.0000 mg | Freq: Once | INTRAMUSCULAR | Status: AC
  Administered 2024-10-24: 25 mg via INTRAVENOUS
  Filled 2024-10-24: qty 1

## 2024-10-24 MED ORDER — PROCHLORPERAZINE EDISYLATE 10 MG/2ML IJ SOLN
10.0000 mg | Freq: Once | INTRAMUSCULAR | Status: AC
  Administered 2024-10-24: 10 mg via INTRAVENOUS
  Filled 2024-10-24: qty 2

## 2024-10-24 MED ORDER — IRBESARTAN 150 MG PO TABS
300.0000 mg | ORAL_TABLET | Freq: Once | ORAL | Status: AC
  Administered 2024-10-24: 300 mg via ORAL
  Filled 2024-10-24: qty 2

## 2024-10-24 MED ORDER — AMLODIPINE BESYLATE 5 MG PO TABS
10.0000 mg | ORAL_TABLET | Freq: Once | ORAL | Status: AC
  Administered 2024-10-24: 10 mg via ORAL
  Filled 2024-10-24: qty 2

## 2024-10-24 MED ORDER — PROCHLORPERAZINE MALEATE 5 MG PO TABS: 5.0000 mg | ORAL_TABLET | Freq: Four times a day (QID) | ORAL | 0 refills | Status: AC | PRN

## 2024-10-24 NOTE — ED Triage Notes (Addendum)
 Pt to ED via POV from home. Pt reports HA that started 14 days ago. Pt reports now turned into migraine. Pt reports N/V, light sensitivity and blurry vision. No relief at home. Pt hypertensive in triage and reports has not taken medication PTA but also has been vomiting.

## 2024-10-24 NOTE — ED Provider Notes (Signed)
 "  [facility] Provider Note    Event Date/Time   First MD Initiated Contact with Patient 10/24/24 [phone]     (approximate)   History   Chief Complaint Migraine   HPI  Patricia Duarte is a 34 y.o. female with past medical history of hypertension, asthma, and migraines who presents to the ED complaining of migraine.  Patient reports that she has been dealing with a gradually worsening headache over the past 2 weeks.  She describes symptoms as a severe migraine with diffuse throbbing pain across her entire head.  She denies any fevers or neck stiffness, has not had any vision changes, speech changes, numbness, or weakness.  She has been taking over-the-counter medication without relief, does admit that she has missed multiple doses of her blood pressure medication recently.  She tried to take her blood pressure medication this morning, but vomited it back up.     Physical Exam   Triage Vital Signs: ED Triage Vitals  Encounter Vitals Group     BP 10/24/24 0718 (!) 188/120     Girls Systolic BP Percentile --      Girls Diastolic BP Percentile --      Boys Systolic BP Percentile --      Boys Diastolic BP Percentile --      Pulse Rate 10/24/24 0718 (!) 102     Resp 10/24/24 0718 20     Temp 10/24/24 0718 98.1 F (36.7 C)     Temp Source 10/24/24 0718 Oral     SpO2 10/24/24 0718 100 %     Weight --      Height --      Head Circumference --      Peak Flow --      Pain Score 10/24/24 0717 10     Pain Loc --      Pain Education --      Exclude from Growth Chart --     Most recent vital signs: Vitals:   10/24/24 0835 10/24/24 0930  BP: (!) 169/109 (!) 160/104  Pulse: 89 84  Resp: 16 16  Temp:    SpO2: 96% 98%    Constitutional: Alert and oriented. Eyes: Conjunctivae are normal.  Pupils equal, round, and reactive to light bilaterally. Head: Atraumatic. Nose: No congestion/rhinnorhea. Mouth/Throat: Mucous membranes are moist.  Neck: Supple with  no meningismus. Cardiovascular: Normal rate, regular rhythm. Grossly normal heart sounds.  2+ radial pulses bilaterally. Respiratory: Normal respiratory effort.  No retractions. Lungs CTAB. Gastrointestinal: Soft and nontender. No distention. Musculoskeletal: No lower extremity tenderness nor edema.  Neurologic:  Normal speech and language. No gross focal neurologic deficits are appreciated.    ED Results / Procedures / Treatments   Labs (all labs ordered are listed, but only abnormal results are displayed) Labs Reviewed  BASIC METABOLIC PANEL WITH GFR - Abnormal; Notable for the following components:      Result Value   Glucose, Bld 125 (*)    All other components within normal limits  CBC WITH DIFFERENTIAL/PLATELET  MAGNESIUM  POC URINE PREG, ED     EKG  ED ECG REPORT I, Carlin Palin, the attending physician, personally viewed and interpreted this ECG.   Date: 10/24/2024  EKG Time: 8:06  Rate: 86  Rhythm: normal sinus rhythm  Axis: Normal  Intervals:Borderline prolonged QT  ST&T Change: None  RADIOLOGY CT head reviewed and interpreted by me with no hemorrhage or midline shift.  PROCEDURES:  Critical Care performed:  No  Procedures   MEDICATIONS ORDERED IN ED: Medications  prochlorperazine  (COMPAZINE ) injection 10 mg (10 mg Intravenous Given 10/24/24 0825)  diphenhydrAMINE  (BENADRYL ) injection 25 mg (25 mg Intravenous Given 10/24/24 0824)  sodium chloride  0.9 % bolus 1,000 mL (0 mLs Intravenous Stopped 10/24/24 0923)  amLODipine  (NORVASC ) tablet 10 mg (10 mg Oral Given 10/24/24 0920)  irbesartan  (AVAPRO ) tablet 300 mg (300 mg Oral Given 10/24/24 0920)     IMPRESSION / MDM / ASSESSMENT AND PLAN / ED COURSE  I reviewed the triage vital signs and the nursing notes.                              34 y.o. female with past medical history of hypertension, asthma, and migraines who presents to the ED with gradually worsening headache over the past 2 weeks associated  with nausea and vomiting today.  Patient's presentation is most consistent with acute presentation with potential threat to life or bodily function.  Differential diagnosis includes, but is not limited to, SAH, tension headache, migraine headache, uncontrolled hypertension, anemia, electrolyte abnormality, AKI.  Patient nontoxic-appearing and in no acute distress, vital signs remarkable for hypertension but otherwise reassuring.  She has a nonfocal neurologic exam and overall low suspicion for [facility] given gradually worsening symptoms over 2 weeks, but given significant hypertension, will check CT head.  EKG and labs are pending, will treat symptomatically with IV Compazine  and Benadryl , hydrate with IV fluids.  CT head is negative for acute process, labs without significant anemia, leukocytosis, electrolyte abnormality, or AKI.  EKG shows no evidence of arrhythmia or ischemia and no evidence of hypertensive emergency at this time.  On reassessment, patient reports that headache is significantly improved and with reassuring workup, she is appropriate for discharge with outpatient follow-up.  BP also improving following dose of her home medication, she states she has plenty of medication available at home.  She was counseled to follow-up with her PCP and to return to the ED for new or worsening symptoms, patient agrees with plan.      FINAL CLINICAL IMPRESSION(S) / ED DIAGNOSES   Final diagnoses:  Migraine with status migrainosus, not intractable, unspecified migraine type  Uncontrolled hypertension     Rx / DC Orders   ED Discharge Orders          Ordered    prochlorperazine  (COMPAZINE ) 5 MG tablet  Every 6 hours PRN        10/24/24 0935             Note:  This document was prepared using Dragon voice recognition software and may include unintentional dictation errors.   Willo Dunnings, MD 10/24/24 [phone]  "
